# Patient Record
Sex: Male | Born: 1985 | Race: White | Hispanic: No | Marital: Single | Smoking: Never smoker
Health system: Southern US, Community
[De-identification: ages and names within clinical notes are randomized; demographics above are authoritative.]

## PROBLEM LIST (undated history)

## (undated) HISTORY — PX: EYE MUSCLE SURGERY: SHX370

---
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
## 2008-04-20 ENCOUNTER — Emergency Department (HOSPITAL_COMMUNITY): Admission: RE | Admit: 2008-04-20 | Discharge: 2008-04-20 | Payer: Self-pay | Admitting: Anesthesiology

## 2008-09-03 IMAGING — CR DG FINGER THUMB 2+V*R*
1 series · 1 of 1 positions shown · non-contrast
Comparison: None.

CLINICAL DATA: Trauma.
 RIGHT THUMB ? FOUR VIEWS:

[view not recorded]
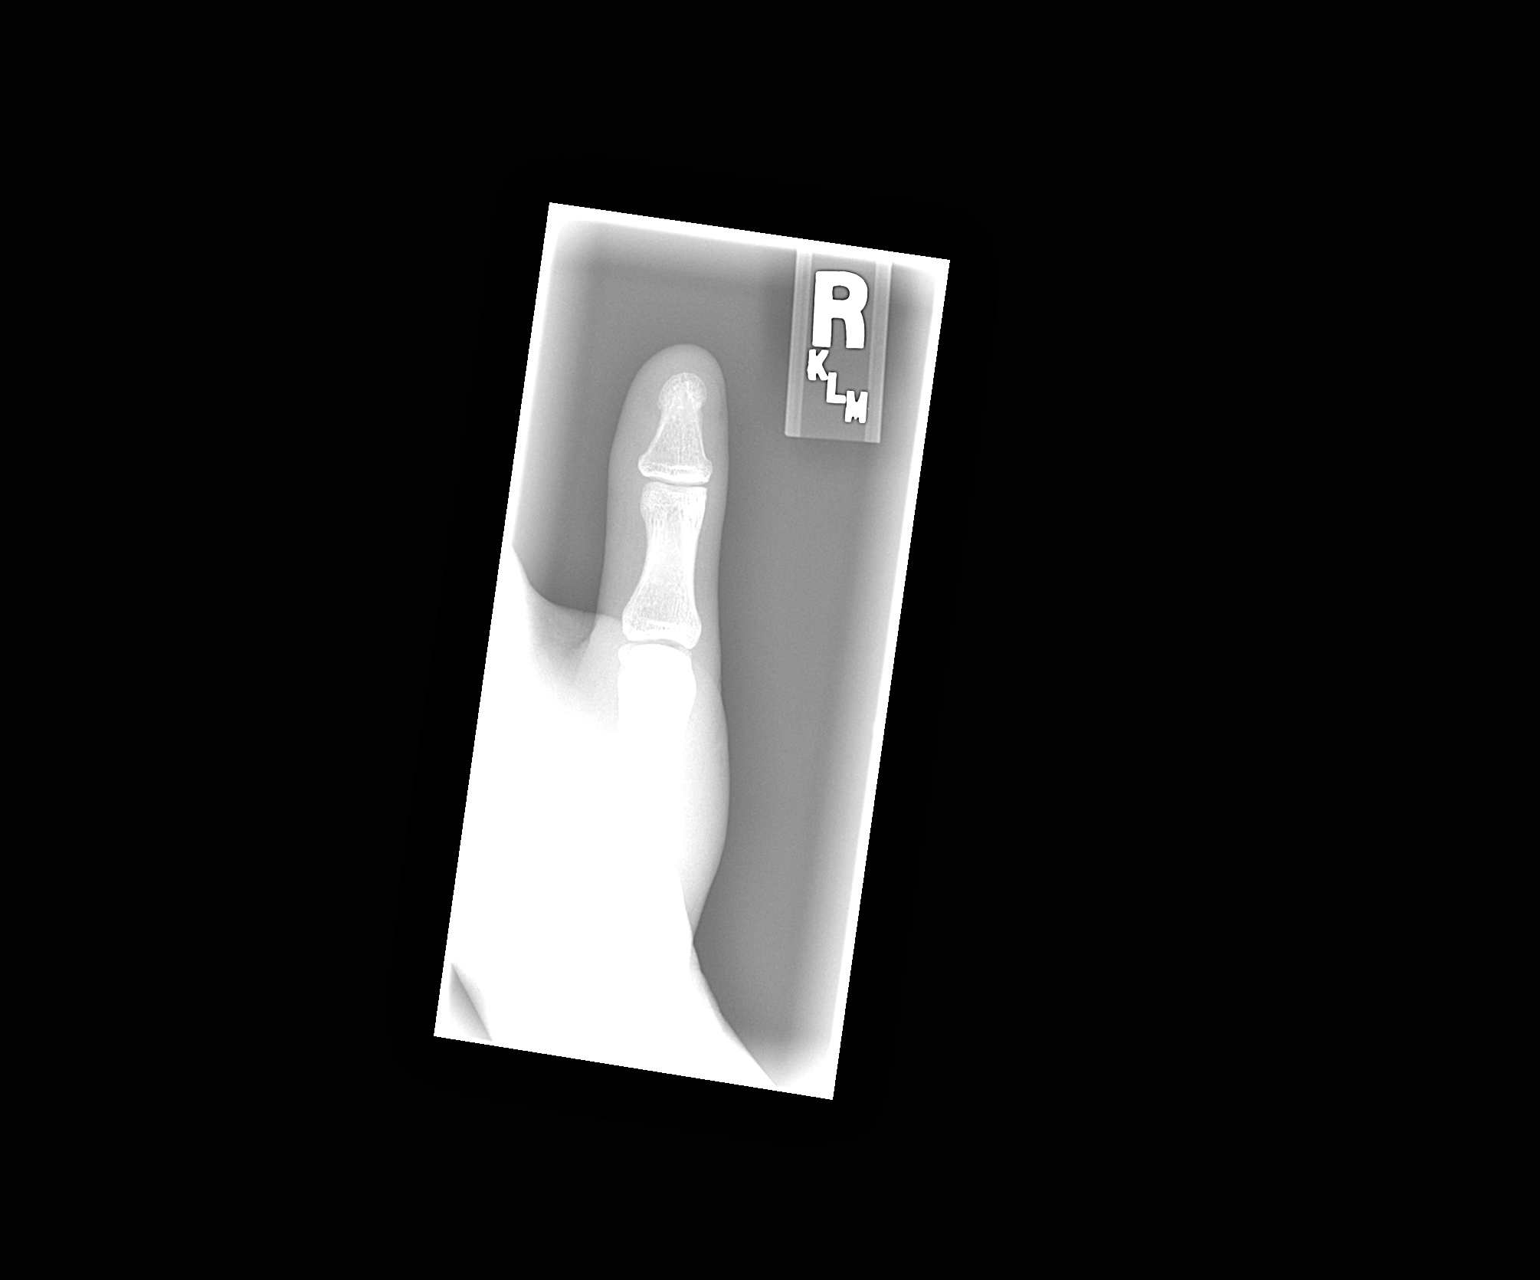

[1 of 1 positions shown; findings below may reference images not displayed]

FINDINGS: There is no fracture or dislocation.  No significant soft tissue swelling.  There are two sesamoid bones incidentally identified.
IMPRESSION: No acute osseous abnormality.

## 2010-02-23 ENCOUNTER — Encounter: Payer: Self-pay | Admitting: Gastroenterology

## 2010-09-05 IMAGING — CR DG CHEST 2V
3 series · 3 of 3 positions shown · non-contrast
Comparison: 10/21/2005.

CLINICAL DATA: Fever and cough.

CHEST - 2 VIEW

[w chest pa (1 of 2)]
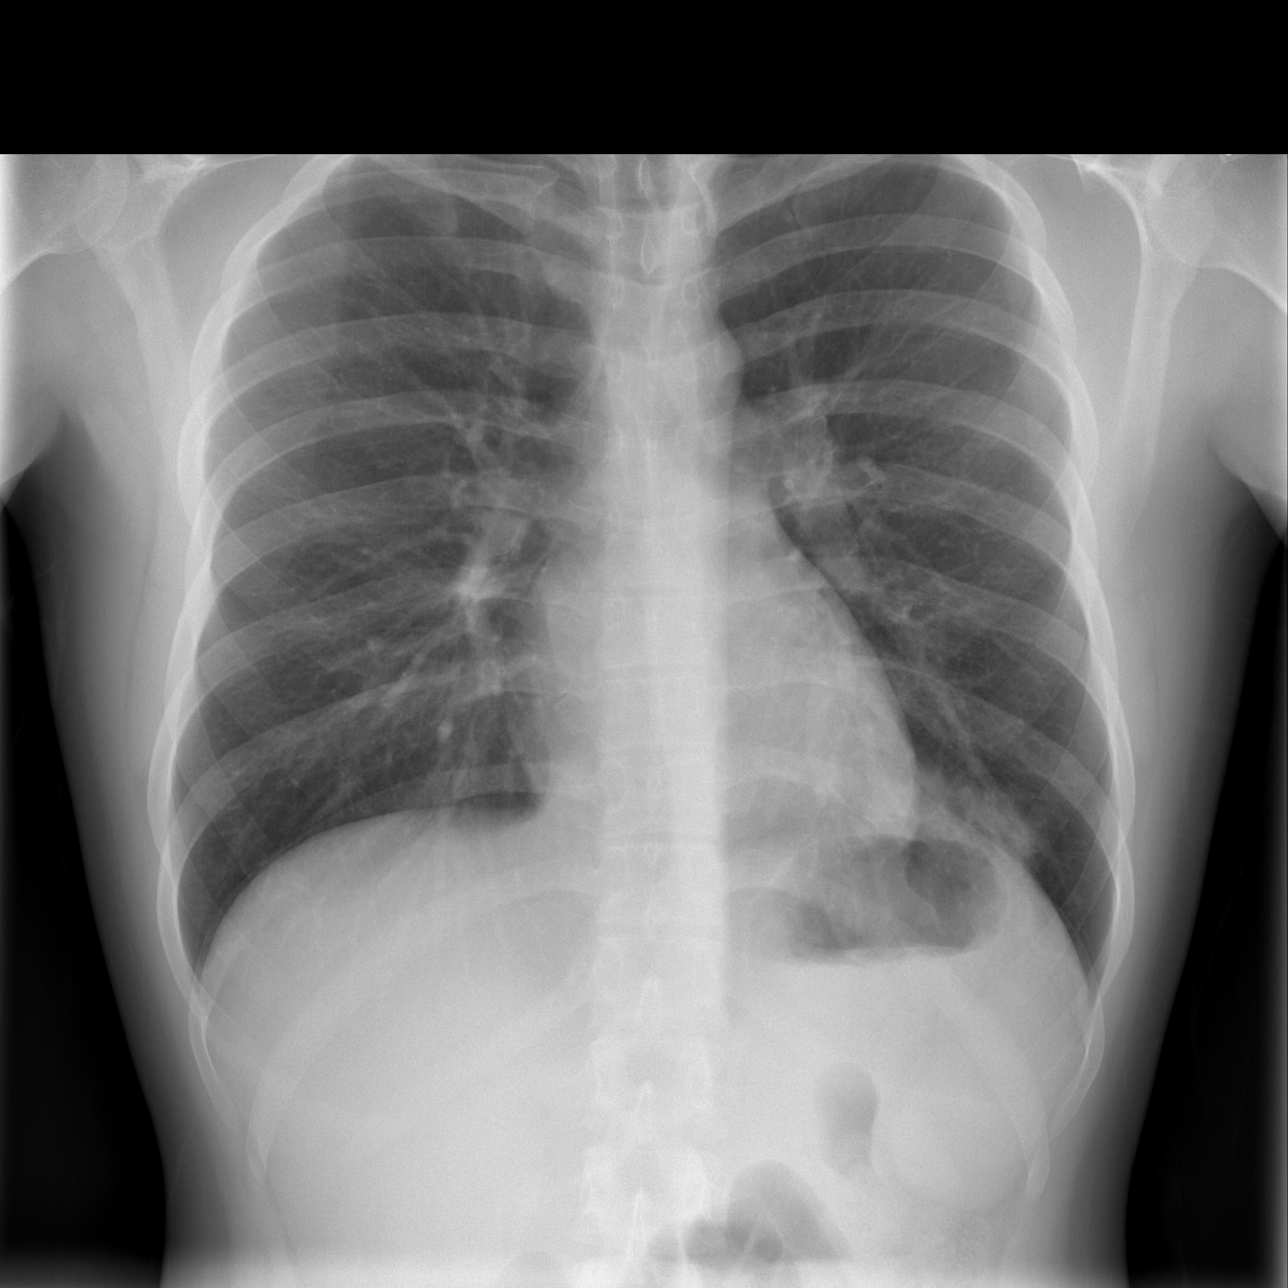

[w chest pa (2 of 2)]
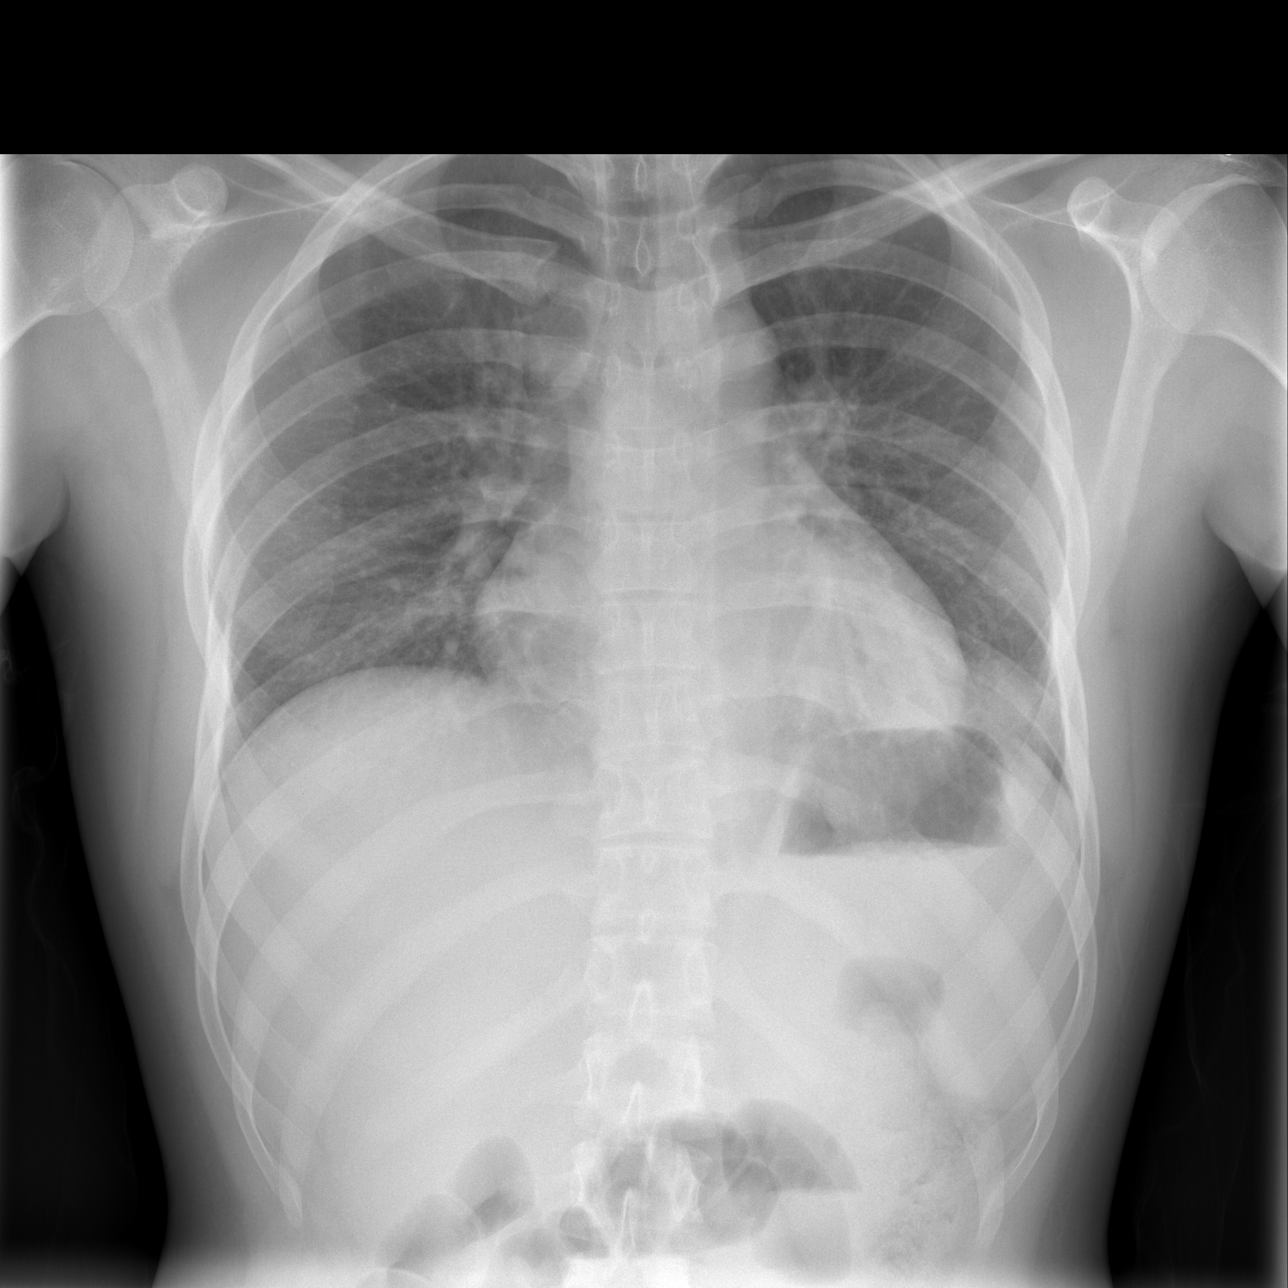

[w chest lat]
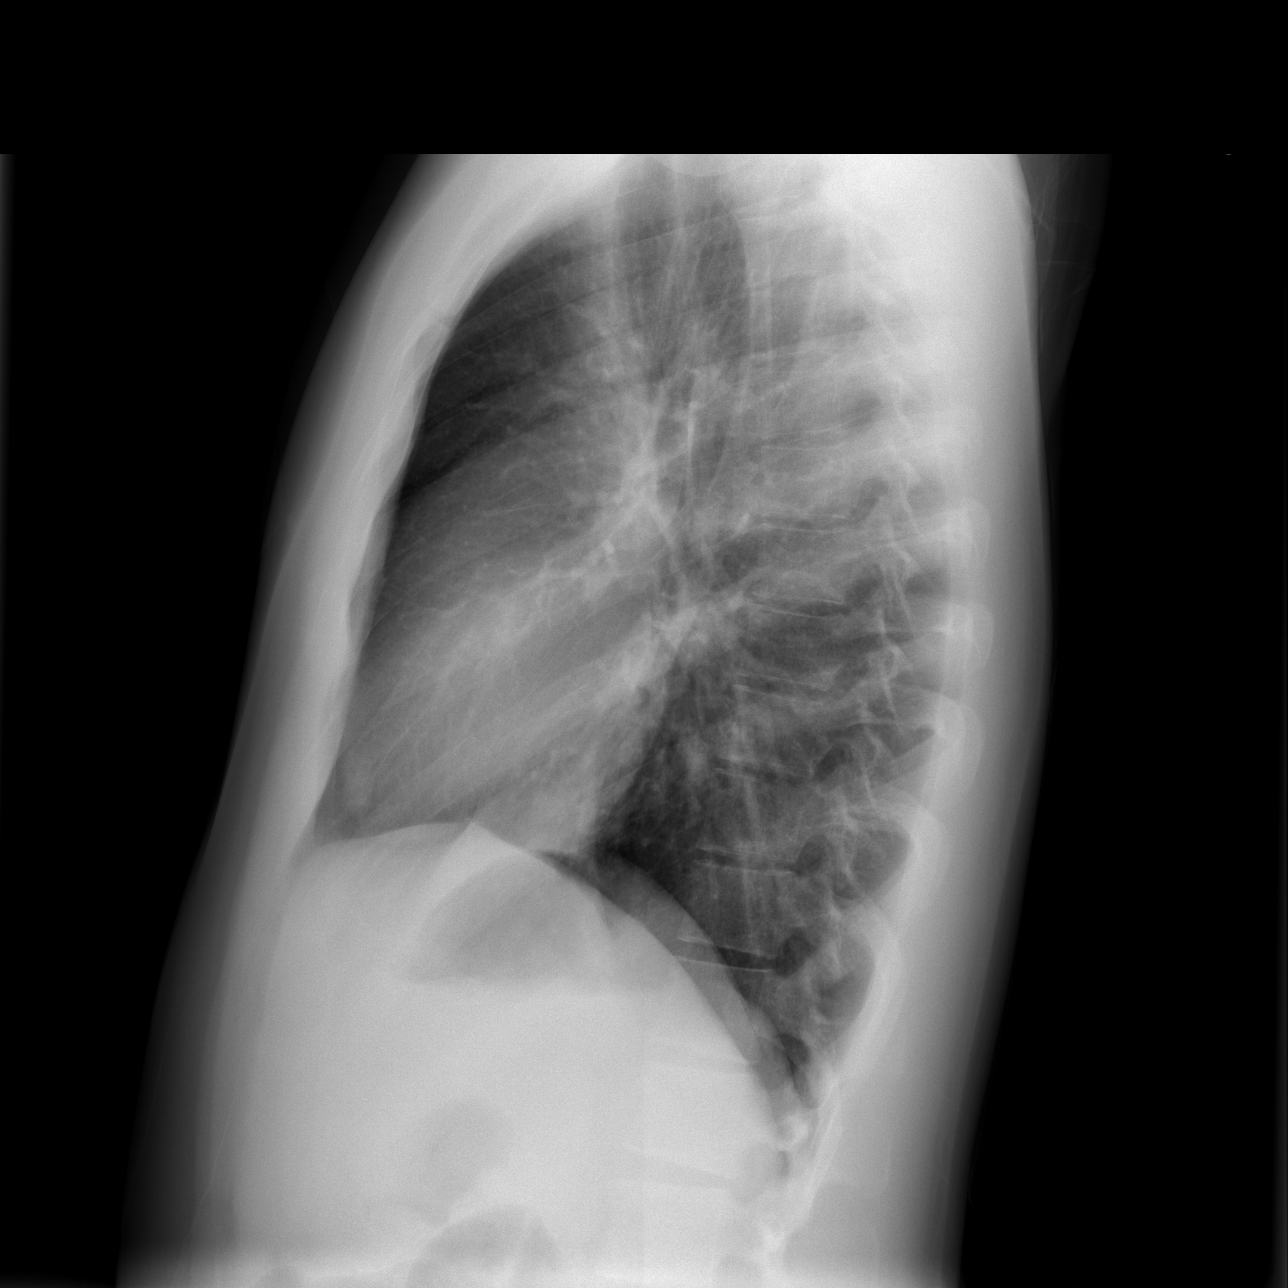

[3 of 3 positions shown; findings below may reference images not displayed]

FINDINGS: Two-view exam shows focal airspace disease at the left
lung base, either in the anterior left lower lower posterior
lingula, consistent with pneumonia.  Right lung is clear. The
cardiopericardial silhouette is within normal limits for size.
Imaged bony structures of the thorax are intact.
IMPRESSION: Focal airspace disease at the left lung base, consistent with
pneumonia.

## 2010-10-18 ENCOUNTER — Emergency Department (HOSPITAL_COMMUNITY): Payer: No Typology Code available for payment source

## 2010-10-18 ENCOUNTER — Emergency Department (HOSPITAL_COMMUNITY)
Admission: EM | Admit: 2010-10-18 | Discharge: 2010-10-18 | Disposition: A | Discharge status: Home / Self Care | Payer: No Typology Code available for payment source | Attending: Emergency Medicine | Admitting: Emergency Medicine

## 2010-10-18 DIAGNOSIS — S91309A Unspecified open wound, unspecified foot, initial encounter: Secondary | ICD-10-CM | POA: Insufficient documentation

## 2010-10-18 DIAGNOSIS — W268XXA Contact with other sharp object(s), not elsewhere classified, initial encounter: Secondary | ICD-10-CM | POA: Insufficient documentation

## 2015-11-28 ENCOUNTER — Ambulatory Visit: Payer: Self-pay | Admitting: Surgery

## 2015-11-28 NOTE — H&P (Signed)
History of Present Illness Gabriel Wagner. Gabriel Cina MD; 11/28/2015 10:42 AM) Patient words: hernia.  The patient is a 30 year old male who presents with an inguinal hernia. Second opinion - right inguinal hernia  He is referred by Dr. Blair Heys for a right inguinal hernia. The patient states that for several years he has noticed some discomfort and then an intermittent bulge in his right groin. It is noticeable with physical activity and particularly with sneezing and coughing. About a year ago he got a tight painful lump in the area which he had to push back in for relief. Since then he is just had some intermittent milder symptoms. No symptoms on the left. No previous history of hernia repair. Generally healthy.  Formerly he was fairly active with cycling and other types of vigorous activity. However he has stopped doing this and his hernia actually feels much better. However he is interested in having repair resume full activity.  His mother is Gabriel Wagner, a Marine scientist.  He is currently in graduate school at [employer] scheduled to finish in December 2017.   Problem List/Past Medical Gabriel Wagner, Gabriel Wagner; 11/28/2015 10:12 AM) INGUINAL HERNIA OF RIGHT SIDE WITHOUT OBSTRUCTION OR GANGRENE (K40.90)  Other Problems Gabriel Wagner, CMA; 11/28/2015 10:12 AM) Anxiety Disorder Depression  Past Surgical History Gabriel Wagner, CMA; 11/28/2015 10:12 AM) Oral Surgery  Diagnostic Studies History Gabriel Wagner, Gabriel Wagner; 11/28/2015 10:12 AM) Colonoscopy never  Allergies Gabriel Wagner, CMA; 11/28/2015 10:12 AM) No Known Drug Allergies 09/06/2015  Medication History Gabriel Wagner, CMA; 11/28/2015 10:12 AM) Lexapro (10MG  Tablet, Oral) Active. Medications Reconciled  Social History Gabriel Wagner, Gabriel Wagner; 11/28/2015 10:12 AM) Alcohol use Occasional alcohol use. Caffeine use Carbonated beverages, Coffee, Tea. No drug use Tobacco use Former  smoker.  Family History Gabriel Wagner, Gabriel Wagner; 11/28/2015 10:12 AM) Family history unknown First Degree Relatives    Vitals Gabriel Wagner CMA; 11/28/2015 10:12 AM) 11/28/2015 10:11 AM Weight: 191.13 lb Height: 67in Body Surface Area: 1.98 m Body Mass Index: 29.93 kg/m  Pulse: 72 (Regular)  BP: 118/76 (Sitting, Left Arm, Standard)      Physical Exam Gabriel Hazard K. Altamese Deguire MD; 11/28/2015 10:43 AM)  The physical exam findings are as follows: Note:WDWN in NAD Eyes: Pupils equal, round; sclera anicteric HENT: Oral mucosa moist; good dentition Neck: No masses palpated, no thyromegaly Lungs: CTA bilaterally; normal respiratory effort CV: Regular rate and rhythm; no murmurs; extremities well-perfused with no edema Abd: +bowel sounds, soft, non-tender, no palpable organomegaly; GU: bilateral descended testes; no testicular masses; small reducible right inguinal hernia; no sign of left inguinal hernia Skin: Warm, dry; no sign of jaundice Psychiatric - alert and oriented x 4; calm mood and affect    Assessment & Plan Gabriel Hazard K. Solveig Fangman MD; 11/28/2015 10:43 AM)  INGUINAL HERNIA OF RIGHT SIDE WITHOUT OBSTRUCTION OR GANGRENE (K40.90)  Current Plans Schedule for Surgery - right inguinal hernia repair with mesh. The surgical procedure has been discussed with the patient. Potential risks, benefits, alternative treatments, and expected outcomes have been explained. All of the patient's questions at this time have been answered. The likelihood of reaching the patient's treatment goal is good. The patient understand the proposed surgical procedure and wishes to proceed. Note:We discussed the options for repair, including laparoscopic versus open right inguinal hernia repair with mesh. After thorough discussion, we have decided to proceed with an open mesh repair.  Gabriel Wagner. Gabriel Skains, MD, [facility]  General/ Trauma Surgery  11/28/2015 10:44 AM

## 2016-02-03 HISTORY — PX: HERNIA REPAIR: SHX51

## 2017-01-04 ENCOUNTER — Encounter (INDEPENDENT_AMBULATORY_CARE_PROVIDER_SITE_OTHER): Payer: Self-pay

## 2017-01-04 ENCOUNTER — Encounter (HOSPITAL_COMMUNITY): Payer: Self-pay | Admitting: Psychiatry

## 2017-01-04 ENCOUNTER — Ambulatory Visit (INDEPENDENT_AMBULATORY_CARE_PROVIDER_SITE_OTHER): Payer: BLUE CROSS/BLUE SHIELD | Admitting: Psychiatry

## 2017-01-04 DIAGNOSIS — Z79899 Other long term (current) drug therapy: Secondary | ICD-10-CM | POA: Diagnosis not present

## 2017-01-04 DIAGNOSIS — F902 Attention-deficit hyperactivity disorder, combined type: Secondary | ICD-10-CM | POA: Diagnosis not present

## 2017-01-04 DIAGNOSIS — F3341 Major depressive disorder, recurrent, in partial remission: Secondary | ICD-10-CM | POA: Diagnosis not present

## 2017-01-04 DIAGNOSIS — R4581 Low self-esteem: Secondary | ICD-10-CM | POA: Diagnosis not present

## 2017-01-04 MED ORDER — LISDEXAMFETAMINE DIMESYLATE 60 MG PO CAPS: 60.0000 mg | ORAL_CAPSULE | Freq: Every day | ORAL | 0 refills | Status: DC

## 2017-01-04 MED ORDER — FLUOXETINE HCL 20 MG PO CAPS: 20.0000 mg | ORAL_CAPSULE | Freq: Every day | ORAL | 1 refills | Status: DC

## 2017-01-04 NOTE — Progress Notes (Signed)
Psychiatric Initial Adult Assessment   Patient Identification: Gabriel Wagner MRN:  [id number] Date of Evaluation:  01/04/2017 Referral Source: self Chief Complaint:  [facility] Diagnosis:    ICD-10-CM   1. Attention deficit hyperactivity disorder (ADHD), combined type F90.2 lisdexamfetamine (VYVANSE) 60 MG capsule    lisdexamfetamine (VYVANSE) 60 MG capsule    lisdexamfetamine (VYVANSE) 60 MG capsule    DISCONTINUED: lisdexamfetamine (VYVANSE) 60 MG capsule    DISCONTINUED: lisdexamfetamine (VYVANSE) 60 MG capsule    DISCONTINUED: lisdexamfetamine (VYVANSE) 60 MG capsule  2. Recurrent major depressive disorder, in partial remission (HCC) F33.41 FLUoxetine (PROZAC) 20 MG capsule    History of Present Illness:  Gabriel Wagner is a 31 year old male with a psychiatric history of ADHD diagnosed at age 89, who has been long managed on stimulant therapies with good control.  He reports that he experienced an episode of depression approximately 5 years ago, and another recent bout of depression approximately 1 year ago.  He has been having psychiatric medication management at [facility], but requests switching his care because he would like to see a physician.   I spent time with the patient getting to know him in learning about his fairly introverted personality style.  He has a Event organisermasters degree in Buyer, retailenvironmental sciences from [employer].  In his free time he likes to play video games and read books, and spend time with his dog.  He currently lives [place] in the [place] area, and his father lives in [place] area.  He reports that he has a sister who is almost 2 years older and they are very close.  He is not currently dating and does not particularly have any interest in romantic relationships at this time.  He reports that he has always struggled with fairly poor self-confidence, and has difficulty in confidence especially when socializing  with others.  He struggles with difficulty with motivation, but some difficulty falling asleep due to rumination and anxiety, he often second guesses himself.  He has trouble with low energy, and wants to stay in bed some days even though he knows he needs to get work done around the house.  He is not currently employed, as it has been challenging to find a job in the area given his particular specific degree.  He continues to apply to the multiple job opportunities nationwide.  He has felt particularly discouraged given that he graduated 1 year ago and has not found employment.  He denies any suicidal thoughts or self-harm.  He drinks alcohol sparingly and has not used marijuana in many years.  Regarding medications, Vyvanse 60 mg appears to be effective for his ADHD symptoms.  Reviewed the [place] controlled substance database and it appears to be appropriate.  He has never been on any antidepressant other than Lexapro.  He feels that Lexapro did help with his first bout of depression in 2013, but it has not been fully effective for the past 6 months to year.  He feels like his mood is not measurable, but not happy.  We discussed switching him to fluoxetine given its higher potency and higher dosing range.  Reviewed the risks and benefits of fluoxetine including common side effects such as GI, sexual, headaches, bruxism.  He agrees to follow-up with writer in 10-12 weeks, and to establish group therapy in this office, as I suspect interpersonal therapies involving group interaction will be most beneficial to him at this time.  Associated  Signs/Symptoms: Depression Symptoms:  depressed mood, anhedonia, fatigue, feelings of worthlessness/guilt, difficulty concentrating, (Hypo) Manic Symptoms:  none Anxiety Symptoms:  Social Anxiety, Psychotic Symptoms:  none PTSD Symptoms: Negative  Past Psychiatric History: No psychiatric hospitalizations, he has had psychiatric medication management at  [facility] with Misty StanleyLisa  Previous Psychotropic Medications: Yes - various stimulants, Vyvanse was best.  Lexapro  Substance Abuse History in the last 12 months:  No.  Consequences of Substance Abuse: Negative  Past Medical History: No past medical history on file.   Family Psychiatric History: Family history of severe depression in his grandfather  Family History: No family history on file.  Social History:   Social History   Socioeconomic History  . Marital status: Single    Spouse name: Not on file  . Number of children: Not on file  . Years of education: Not on file  . Highest education level: Not on file  Social Needs  . Financial resource strain: Not on file  . Food insecurity - worry: Not on file  . Food insecurity - inability: Not on file  . Transportation needs - medical: Not on file  . Transportation needs - non-medical: Not on file  Occupational History  . Not on file  Tobacco Use  . Smoking status: Not on file  Substance and Sexual Activity  . Alcohol use: Not on file  . Drug use: Not on file  . Sexual activity: Not on file  Other Topics Concern  . Not on file  Social History Narrative  . Not on file    Additional Social History: Has a masters degree from [employer] in Camera operatorenvironmental sciences and management, wants to work in environmental conservation, not currently married, no children, does not use drugs or drink alcohol excessively  Allergies:  Allergies not on file  Metabolic Disorder Labs: No results found for: HGBA1C, MPG No results found for: PROLACTIN No results found for: CHOL, TRIG, HDL, CHOLHDL, VLDL, LDLCALC   Current Medications: Current Outpatient Medications  Medication Sig Dispense Refill  . FLUoxetine (PROZAC) 20 MG capsule Take 1 capsule (20 mg total) by mouth daily. In 3-4 weeks, okay to increase to 40 mg daily 90 capsule 1  . [START ON 01/29/2017] lisdexamfetamine (VYVANSE) 60 MG capsule Take 1 capsule (60 mg  total) by mouth daily before breakfast. 30 capsule 0  . [START ON 02/28/2017] lisdexamfetamine (VYVANSE) 60 MG capsule Take 1 capsule (60 mg total) by mouth daily before breakfast. 30 capsule 0  . [START ON 03/30/2017] lisdexamfetamine (VYVANSE) 60 MG capsule Take 1 capsule (60 mg total) by mouth daily before breakfast. 30 capsule 0   No current facility-administered medications for this visit.     Neurologic: Headache: Negative Seizure: Negative Paresthesias:Negative  Musculoskeletal: Strength & Muscle Tone: within normal limits Gait & Station: normal Patient leans: N/A  Psychiatric Specialty Exam: Review of Systems  Constitutional: Negative.   HENT: Negative.   Eyes: Negative.   Respiratory: Negative.   Cardiovascular: Negative.   Gastrointestinal: Negative.   Musculoskeletal: Negative.   Neurological: Negative.   Psychiatric/Behavioral: Positive for depression. The patient has insomnia.     There were no vitals taken for this visit.There is no height or weight on file to calculate BMI.  General Appearance: Casual and Well Groomed  Eye Contact:  Good  Speech:  Clear and Coherent  Volume:  Normal  Mood:  Anxious and Dysphoric  Affect:  Appropriate and Congruent  Thought Process:  Goal Directed and Descriptions of  Associations: Intact  Orientation:  Full (Time, Place, and Person)  Thought Content:  Logical and Rumination  Suicidal Thoughts:  No  Homicidal Thoughts:  No  Memory:  Immediate;   Fair  Judgement:  Fair  Insight:  Fair  Psychomotor Activity:  Normal  Concentration:  Attention Span: Fair  Recall:  [employer] of Knowledge:Fair  Language: Good  Akathisia:  Negative  Handed:  Right  AIMS (if indicated):  n/a  Assets:  Communication Skills Desire for Improvement Financial Resources/Insurance Housing Leisure Time Physical Health Social Support Transportation Vocational/Educational  ADL's:  Intact  Cognition: WNL  Sleep:  6-8 hours, onsent insomnia     Treatment Plan Summary: JOSMAR MESSIMER is a 31 year old male with a psychiatric history consistent with combined ADHD, long-standing since childhood, and recurrent major depressive disorder in partial remission.  He presents as fairly introverted, and struggles with poor self-esteem, specifically in light of his recent difficulties in finding employment.  He would benefit from both group therapy and change to a more potent antidepressant.  We agreed to proceed as below and follow-up in 10-12 weeks.  He does not present any acute safety issues or substance use.  1. Attention deficit hyperactivity disorder (ADHD), combined type   2. Recurrent major depressive disorder, in partial remission (HCC)     Status of current problems: new  Labs Ordered: No orders of the defined types were placed in this encounter.   Labs Reviewed: n/a  Collateral Obtained/Records Reviewed: [place] controlled substance database - seen Ellis Savage for the last year for med management  Plan:  Vyvanse 60 mg daily; 3 months prescribed Stop Lexapro 10 mg daily Initiate Prozac 20 mg, increased to 40 mg in 3-4 weeks as tolerated RTC 10-12 weeks Group therapy in office  I spent 50 minutes with the patient in direct face-to-face clinical care.  Greater than 50% of this time was spent in counseling and coordination of care with the patient.    [name], MD 12/3/201810:18 AM

## 2017-01-29 ENCOUNTER — Other Ambulatory Visit (HOSPITAL_COMMUNITY): Payer: Self-pay | Admitting: Psychiatry

## 2017-01-29 DIAGNOSIS — F902 Attention-deficit hyperactivity disorder, combined type: Secondary | ICD-10-CM

## 2017-02-10 ENCOUNTER — Ambulatory Visit (HOSPITAL_COMMUNITY): Payer: Self-pay | Admitting: Licensed Clinical Social Worker

## 2017-02-17 ENCOUNTER — Ambulatory Visit (INDEPENDENT_AMBULATORY_CARE_PROVIDER_SITE_OTHER): Payer: BLUE CROSS/BLUE SHIELD | Admitting: Licensed Clinical Social Worker

## 2017-02-17 DIAGNOSIS — F3341 Major depressive disorder, recurrent, in partial remission: Secondary | ICD-10-CM | POA: Diagnosis not present

## 2017-02-17 DIAGNOSIS — F902 Attention-deficit hyperactivity disorder, combined type: Secondary | ICD-10-CM

## 2017-02-19 ENCOUNTER — Encounter (HOSPITAL_COMMUNITY): Payer: Self-pay | Admitting: Licensed Clinical Social Worker

## 2017-02-19 NOTE — Progress Notes (Signed)
  Weekly Group Progress Note  Program: OUTPATIENT SKILLS GROUP  Group Time: 5:30-6:30pm  Participation Level: Active  Behavioral Response: Appropriate and Sharing  Type of Therapy:  Psycho-education Group  Skills discussed: Self Identity, Mindfulness   Summary of Progress: Pt was new to group and introduced himself. He states he wants help w/ depression and isolating. Pt shared openly and admitted he has had a hx of "having nervous breakdowns" from depression.   Summary of Group: Counselor engaged pts in group psychoeducation on self identity, mindfulness, and recovery for depression, anxiety, and addiction. Counselor assisted 2 new members in introducing themselves and identifying how the group could help them meet their goals.    Gabriel Wagner, LPCA, LCASA

## 2017-02-24 ENCOUNTER — Ambulatory Visit (HOSPITAL_COMMUNITY): Payer: Self-pay | Admitting: Licensed Clinical Social Worker

## 2017-03-03 ENCOUNTER — Ambulatory Visit (INDEPENDENT_AMBULATORY_CARE_PROVIDER_SITE_OTHER): Payer: BLUE CROSS/BLUE SHIELD | Admitting: Licensed Clinical Social Worker

## 2017-03-03 DIAGNOSIS — F902 Attention-deficit hyperactivity disorder, combined type: Secondary | ICD-10-CM | POA: Diagnosis not present

## 2017-03-03 DIAGNOSIS — F3341 Major depressive disorder, recurrent, in partial remission: Secondary | ICD-10-CM | POA: Diagnosis not present

## 2017-03-04 ENCOUNTER — Encounter (HOSPITAL_COMMUNITY): Payer: Self-pay | Admitting: Licensed Clinical Social Worker

## 2017-03-04 NOTE — Progress Notes (Signed)
  Weekly Group Progress Note  Program: OUTPATIENT SKILLS GROUP  Group Time: 5:30-6:30pm  Participation Level: Active  Behavioral Response: Appropriate and Sharing  Type of Therapy:  Psycho-education Group  Skills discussed: Self Compassion   Summary of Progress: Pt was active and engaged. He spoke openly and somewhat defensively about how "saying something to yourself does not make it so". Pt struggled to believe in the power of positive thinking but was encouraged to "try it out" by counselor. Pt checked in as a "7 on a 10point scale w/ 10 being most positive". Summary of Group:  Pts were active and engaged in group psychoed session in which counselor taught on the concept and practice of "self compassion", mindfulness, and self-kindness. Counselor used CBT-style investigating techniques to help pts challenge their  "negative shoulds".    Margo CommonWesley E Swan, LPCA, LCASA

## 2017-03-10 ENCOUNTER — Ambulatory Visit (INDEPENDENT_AMBULATORY_CARE_PROVIDER_SITE_OTHER): Payer: BLUE CROSS/BLUE SHIELD | Admitting: Licensed Clinical Social Worker

## 2017-03-10 DIAGNOSIS — F902 Attention-deficit hyperactivity disorder, combined type: Secondary | ICD-10-CM | POA: Diagnosis not present

## 2017-03-10 DIAGNOSIS — F3341 Major depressive disorder, recurrent, in partial remission: Secondary | ICD-10-CM

## 2017-03-12 ENCOUNTER — Encounter (HOSPITAL_COMMUNITY): Payer: Self-pay | Admitting: Licensed Clinical Social Worker

## 2017-03-12 NOTE — Progress Notes (Signed)
  Weekly Group Progress Note  Program: OUTPATIENT SKILLS GROUP  Group Time: 5:30-6:30pm  Participation Level: Active  Behavioral Response: Appropriate and Sharing  Type of Therapy:  Psycho-education Group  Skills discussed: CBT ABC    Summary of Progress: Pt was active during group and shared about his feeling "stuck w/ the issue of Self Compassion (last week's topic)". Pt reports he continues to get "fixated on his negative 'shoulds' he has for himself such as 'I should be out w/ friends more'". Pt appeared somewhat detached from group discussion and noticeably did not make eye contact w/ others when they spoke. Pt states he feels like the color "smokey blue" since he is feeling neutral but "not ordinary".   Summary of Group: Pts were active and engaged in session. Counselor asked pts to state "what color they felt like today" to build self awareness and abstraction. 2 new group members were present and shared openly about their struggle w/ substance addiction. Counselor showed brief video describing ABC model and how it can help pts to minimize and challenge their negative beliefs about activating events.    Margo CommonWesley E Al Gagen, LPCA, LCASA

## 2017-03-17 ENCOUNTER — Ambulatory Visit (INDEPENDENT_AMBULATORY_CARE_PROVIDER_SITE_OTHER): Payer: BLUE CROSS/BLUE SHIELD | Admitting: Licensed Clinical Social Worker

## 2017-03-17 DIAGNOSIS — F902 Attention-deficit hyperactivity disorder, combined type: Secondary | ICD-10-CM | POA: Diagnosis not present

## 2017-03-17 DIAGNOSIS — F3341 Major depressive disorder, recurrent, in partial remission: Secondary | ICD-10-CM | POA: Diagnosis not present

## 2017-03-22 ENCOUNTER — Encounter (HOSPITAL_COMMUNITY): Payer: Self-pay | Admitting: Licensed Clinical Social Worker

## 2017-03-22 NOTE — Progress Notes (Signed)
  Weekly Group Progress Note  Program: OUTPATIENT SKILLS GROUP  Group Time: 5:30-6:30pm  Participation Level: Active  Behavioral Response: Appropriate and Sharing  Type of Therapy:  Psycho-education Group  Skills discussed: Mindfulness, Concentration   Summary of Progress: Pt was active and engaged and less agitated than previous sessions. He reported he is continuing to notice his internal self talk, "shoulds", and negative thoughts that impact his functioning. He states that learning to "label his thoughts has been helpful". Pt stated he has been practicing mindfulness script for over 337yrs when he was first dx w/ migraines and it has proven very helpful.  Summary of Group: Patients were active and engaged in psychoeducation skill building group designed to help sustain recovery from anxiety, depression, and substance addiction. Pts were asked to rate their current feeling state on a 1-10 scale. Pts were led in a mindfulness exercise to help build focus and concentration. Counselor discussed pt's experience of meditation and how it could be applied to their unique recoveries.   Margo CommonWesley E Swan, LPCA, LCASA

## 2017-03-29 ENCOUNTER — Encounter (HOSPITAL_COMMUNITY): Payer: Self-pay | Admitting: Psychiatry

## 2017-03-29 ENCOUNTER — Ambulatory Visit (INDEPENDENT_AMBULATORY_CARE_PROVIDER_SITE_OTHER): Payer: BLUE CROSS/BLUE SHIELD | Admitting: Psychiatry

## 2017-03-29 DIAGNOSIS — F3341 Major depressive disorder, recurrent, in partial remission: Secondary | ICD-10-CM | POA: Diagnosis not present

## 2017-03-29 DIAGNOSIS — F902 Attention-deficit hyperactivity disorder, combined type: Secondary | ICD-10-CM

## 2017-03-29 DIAGNOSIS — Z79899 Other long term (current) drug therapy: Secondary | ICD-10-CM

## 2017-03-29 DIAGNOSIS — G479 Sleep disorder, unspecified: Secondary | ICD-10-CM | POA: Diagnosis not present

## 2017-03-29 MED ORDER — ARIPIPRAZOLE 2 MG PO TABS: 2.0000 mg | ORAL_TABLET | Freq: Every day | ORAL | 2 refills | Status: DC

## 2017-03-29 MED ORDER — LISDEXAMFETAMINE DIMESYLATE 60 MG PO CAPS: 60.0000 mg | ORAL_CAPSULE | Freq: Every day | ORAL | 0 refills | Status: DC

## 2017-03-29 MED ORDER — FLUOXETINE HCL 40 MG PO CAPS: 40.0000 mg | ORAL_CAPSULE | Freq: Every day | ORAL | 1 refills | Status: DC

## 2017-03-29 NOTE — Progress Notes (Signed)
BH MD/PA/NP OP Progress Note  03/29/2017 3:07 PM Gabriel HoarBenjamin R Betten  MRN:  [id number]  Chief Complaint: depression  HPI: Gabriel Wagner feels that his apathy has improved, but he continues to struggle with anhedonia.  He is going to be working in individual therapy with Wes and feels hopeful that this will also help.  He reports that not much has changed, but with further questioning it appears that he has gotten a job, and is playing less video games and is spending more time with friends.  He has limited insight into some of the improvements.  He continues to have middle of the night awakenings and morning melancholy.  He has been participating in group therapy which has been helpful.  He denies any acute safety issues.  Spent time discussing the utility of Abilify as an augmenting agent with Prozac and he was agreeable to this intervention. we discussed EPS including TD, akathisia, tremulousness, dystonia, and metabolic effects of atypical antipsychotic.  He agrees to initiate low dose and agrees to follow-up in 8 weeks.  Visit Diagnosis:    ICD-10-CM   1. Recurrent major depressive disorder, in partial remission (HCC) F33.41 FLUoxetine (PROZAC) 40 MG capsule    ARIPiprazole (ABILIFY) 2 MG tablet  2. Attention deficit hyperactivity disorder (ADHD), combined type F90.2 lisdexamfetamine (VYVANSE) 60 MG capsule    Past Psychiatric History: See intake H&P for full details. Reviewed, with no updates at this time.   Past Medical History: History reviewed. No pertinent past medical history.  Past Surgical History:  Procedure Laterality Date  . EYE MUSCLE SURGERY    . HERNIA REPAIR  2018    Family Psychiatric History: See intake H&P for full details. Reviewed, with no updates at this time.   Family History: History reviewed. No pertinent family history.  Social History:  Social History   Socioeconomic History  . Marital status: Single    Spouse name: None  . Number of children: None   . Years of education: None  . Highest education level: None  Social Needs  . Financial resource strain: None  . Food insecurity - worry: None  . Food insecurity - inability: None  . Transportation needs - medical: None  . Transportation needs - non-medical: None  Occupational History  . None  Tobacco Use  . Smoking status: Never Smoker  . Smokeless tobacco: Never Used  Substance and Sexual Activity  . Alcohol use: Yes    Alcohol/week: 1.2 oz    Types: 2 Glasses of wine per week    Comment: once a month or less  . Drug use: No  . Sexual activity: Not Currently  Other Topics Concern  . None  Social History Narrative  . None    Allergies: No Known Allergies  Metabolic Disorder Labs: No results found for: HGBA1C, MPG No results found for: PROLACTIN No results found for: CHOL, TRIG, HDL, CHOLHDL, VLDL, LDLCALC No results found for: TSH  Therapeutic Level Labs: No results found for: LITHIUM No results found for: VALPROATE No components found for:  CBMZ  Current Medications: Current Outpatient Medications  Medication Sig Dispense Refill  . FLUoxetine (PROZAC) 40 MG capsule Take 1 capsule (40 mg total) by mouth daily. 90 capsule 1  . [START ON 03/30/2017] lisdexamfetamine (VYVANSE) 60 MG capsule Take 1 capsule (60 mg total) by mouth daily before breakfast. 30 capsule 0  . [START ON 04/28/2017] lisdexamfetamine (VYVANSE) 60 MG capsule Take 1 capsule (60 mg total) by mouth daily before breakfast.  30 capsule 0  . ARIPiprazole (ABILIFY) 2 MG tablet Take 1 tablet (2 mg total) by mouth daily. 30 tablet 2  . lisdexamfetamine (VYVANSE) 60 MG capsule Take 1 capsule (60 mg total) by mouth daily before breakfast. 30 capsule 0   No current facility-administered medications for this visit.      Musculoskeletal: Strength & Muscle Tone: within normal limits Gait & Station: normal Patient leans: N/A  Psychiatric Specialty Exam: ROS  Blood pressure 122/80, pulse 89, height 5\' 7"   (1.702 m), weight 189 lb 12.8 oz (86.1 kg).Body mass index is 29.73 kg/m.  General Appearance: Casual and Fairly Groomed  Eye Contact:  Good  Speech:  Clear and Coherent and Normal Rate  Volume:  Normal  Mood:  Dysphoric  Affect:  Congruent  Thought Process:  Goal Directed and Descriptions of Associations: Intact  Orientation:  Full (Time, Place, and Person)  Thought Content: Logical   Suicidal Thoughts:  No  Homicidal Thoughts:  No  Memory:  Immediate;   Fair  Judgement:  Fair  Insight:  Fair  Psychomotor Activity:  Normal  Concentration:  Attention Span: Good  Recall:  Good  Fund of Knowledge: Good  Language: Good  Akathisia:  Negative  Handed:  Right  AIMS (if indicated): not done  Assets:  Communication Skills Desire for Improvement Financial Resources/Insurance Housing Social Support Transportation Vocational/Educational  ADL's:  Intact  Cognition: WNL  Sleep:  Good   Screenings:  Assessment and Plan: [name] presents with slight improvements in mood, but continues to feel quite depressed, and anhedonic.  He has been participating well in group therapy and will be starting individual therapy also.  We agreed to initiate Abilify augmentation with continued fluoxetine.  He is happy to share that he has gotten a job recently which is a positive change for him.  No acute safety issues and we will follow-up in 8 weeks.  1. Recurrent major depressive disorder, in partial remission (HCC)   2. Attention deficit hyperactivity disorder (ADHD), combined type    Status of current problems: unchanged  Labs Ordered: No orders of the defined types were placed in this encounter.  Labs Reviewed: n/a  Collateral Obtained/Records Reviewed: n/a  Plan:  Abilify 2 mg daily Prozac 40 mg daily Vyvanse 60 mg daily  I spent 25 minutes with the patient in direct face-to-face clinical care.  Greater than 50% of this time was spent in counseling and coordination of care with  the patient.    [name], MD 03/29/2017, 3:07 PM

## 2017-04-06 ENCOUNTER — Encounter (HOSPITAL_COMMUNITY): Payer: Self-pay | Admitting: Licensed Clinical Social Worker

## 2017-04-06 ENCOUNTER — Ambulatory Visit (INDEPENDENT_AMBULATORY_CARE_PROVIDER_SITE_OTHER): Payer: BLUE CROSS/BLUE SHIELD | Admitting: Licensed Clinical Social Worker

## 2017-04-06 DIAGNOSIS — F3341 Major depressive disorder, recurrent, in partial remission: Secondary | ICD-10-CM

## 2017-04-07 ENCOUNTER — Ambulatory Visit (INDEPENDENT_AMBULATORY_CARE_PROVIDER_SITE_OTHER): Payer: BLUE CROSS/BLUE SHIELD | Admitting: Licensed Clinical Social Worker

## 2017-04-07 DIAGNOSIS — F3341 Major depressive disorder, recurrent, in partial remission: Secondary | ICD-10-CM | POA: Diagnosis not present

## 2017-04-07 DIAGNOSIS — F902 Attention-deficit hyperactivity disorder, combined type: Secondary | ICD-10-CM | POA: Diagnosis not present

## 2017-04-08 ENCOUNTER — Encounter (HOSPITAL_COMMUNITY): Payer: Self-pay | Admitting: Licensed Clinical Social Worker

## 2017-04-08 NOTE — Progress Notes (Signed)
   THERAPIST PROGRESS NOTE  Session Time: 10-11  Participation Level: Active  Behavioral Response: Well GroomedAlertEuthymic  Type of Therapy: Individual Therapy  Treatment Goals addressed: Anxiety and Diagnosis: MDD  Interventions: CBT and Supportive  Summary: Gabriel HoarBenjamin R Wagner is a 32 y.o. male who presents with hx of anxious depression. He is active and engaged in session, well dressed, and has recently received a haircut. He appears more upbeat than previous group sessions. He reports he is feeling better but has not yet "felt the effects of adding Abilify" per Dr. Unice BaileyEksir's recommendation.   Pt discusses his hx of depression and anxiety beginning "primarily in 2016" when he had to take academic leave from his masters at [employer] in the final month before graduation. He states he felt "off, was smoking a lot of weed, and felt incredibly anxious most days". Pt states he took some time off, worked part time for a year, and ended up finishing his masters in [employer] at [employer] in May 2017. Pt reports he currently feels a near constant "fear of rejection" and called it a "theme of his life", which pt states is exhausting. Pt reports having to feel "on top of the world in order to give my best".   Pt spends extended time discussing his HS experience, liking it, feeling successful at [employer]. He had strong social support, friend group, and felt challenged and appreciated by his teachers.   Pt is interested in discussing Enneagram types, per recommendation of counseor. Pt believes he is a type 5 w/ a 6 wing.   Suicidal/Homicidal: Nowithout intent/plan  Therapist Response: Counselor used cognitive approaches to help pt identify irrational and challenge irrational beliefs. Counselor gathered social history and specific social patterns from [employer] and college. Counselor used open questions and supportive encouragement, reflecting that pt is reporting improved mood and  decrease in depression sxs.  Plan: Return again in 2 weeks.  Diagnosis:    ICD-10-CM   1. Recurrent major depressive disorder, in partial remission [facility](HCC) F33.41        Margo CommonWesley E Swan, LCAS-A 04/08/2017

## 2017-04-09 ENCOUNTER — Encounter (HOSPITAL_COMMUNITY): Payer: Self-pay | Admitting: Licensed Clinical Social Worker

## 2017-04-09 NOTE — Progress Notes (Signed)
  Weekly Group Progress Note  Program: OUTPATIENT SKILLS GROUP  Group Time: 5:30-6:30pm  Participation Level: Active  Behavioral Response: Appropriate and Sharing  Type of Therapy:  Psycho-education Group  Skills discussed: Honesty in recovery, Self talk   Summary of Progress: Pt was active and engaged. He reported he continues to feel more stable and upbeat in past 2 weeks. He was well-groomed and smiled often. He shared that he enjoyed "getting other perspectives" from the group's mixture of mental health dxs. Pt gained insight and shared about his anxiety w/ his family over the most recent Thanksgiving dinner. Pt admitted he felt "weird' about feeling anxious about being w/ his family though he appeared more accepting of it.  Summary of Group: Pts were active and engaged in group skillbuilding session for anxiety, depression, and Chemical Dependency. Pts were encouraged to share about their experience of recovery, skills used, and challenges faced in past week. Pts check in briefly then processed, then learned skill of "how to get honest w/ yourself by assessing your self talk".   Margo CommonWesley E Swan, LPCA, LCASA

## 2017-04-13 ENCOUNTER — Encounter (HOSPITAL_COMMUNITY): Payer: Self-pay | Admitting: Licensed Clinical Social Worker

## 2017-04-13 ENCOUNTER — Ambulatory Visit (INDEPENDENT_AMBULATORY_CARE_PROVIDER_SITE_OTHER): Payer: BLUE CROSS/BLUE SHIELD | Admitting: Licensed Clinical Social Worker

## 2017-04-13 DIAGNOSIS — F902 Attention-deficit hyperactivity disorder, combined type: Secondary | ICD-10-CM

## 2017-04-13 DIAGNOSIS — F3341 Major depressive disorder, recurrent, in partial remission: Secondary | ICD-10-CM | POA: Diagnosis not present

## 2017-04-13 NOTE — Progress Notes (Signed)
   THERAPIST PROGRESS NOTE  Session Time: 10-11  Participation Level: Active  Behavioral Response: Neat and Well GroomedAlertEuthymic  Type of Therapy: Individual Therapy  Treatment Goals addressed: Anxiety  Interventions: CBT and Supportive  Summary: Gabriel Wagner is a 32 y.o. male who presents with hx of anxious depression and is recently responding positively to adjunctive use of Abilify w/ Prozac.   "Abilify is working very well; I woke up last Thursday and sprang out of bed; I worked out and I applied for an internship w/ [employer]" (met tx goals) Pt states he was motivated to work on his goals and is responding to medication.  "I still feel anxious and am not sure why that is; perhaps it has something to do w/ how 'high achieving' my family is".  "My parents divorced in 2003 and I think that had a big impact on me"  "I was always concerned w/ 'doing the right thing' as a child/adolescent; but grades didn't really matter to me; I liked studying for the sake of learning".   "I am a a lot like my anxious grandfather who had PTSD from WW2, was 'overly humble,' and worked for [employer]  Pt discusses his [employer] and how revolutionary his ideas were and how he was disappointed in himself since he was depressed in the midst of finishing the degree. Pt states he often tells himself that "failure is not an option". Counselor reflects that "perhaps pt is more ambitious than he realizes he is". Pt discusses ways that he "still judges himself for having depression, though he is trying not to".   Pt feels intrigued by "nonattachment" in Buddhism. Pt wants to explore mindfulness and how to make it apply more into his day-to-day life.  Suicidal/Homicidal: Nowithout intent/plan  Therapist Response: Counselor used open question, socratic questioning, reflection, empathy, and positive regard. Counselor used reflection of thoughts to help pt gain insight into his negative schema of  "failure is not an option". Counselor encouraged continued medication use since it appears to be working effectively.  Plan: Return again in 1 weeks.  Diagnosis:    ICD-10-CM   1. Recurrent major depressive disorder, in partial remission ([place]) F33.41   2. Attention deficit hyperactivity disorder (ADHD), combined type F90.2        Gabriel Wagner, LCAS-A 04/13/2017

## 2017-04-14 ENCOUNTER — Ambulatory Visit (HOSPITAL_COMMUNITY): Payer: Self-pay | Admitting: Licensed Clinical Social Worker

## 2017-04-20 ENCOUNTER — Ambulatory Visit (HOSPITAL_COMMUNITY): Payer: Self-pay | Admitting: Licensed Clinical Social Worker

## 2017-04-21 ENCOUNTER — Ambulatory Visit (INDEPENDENT_AMBULATORY_CARE_PROVIDER_SITE_OTHER): Payer: BLUE CROSS/BLUE SHIELD | Admitting: Licensed Clinical Social Worker

## 2017-04-21 DIAGNOSIS — F902 Attention-deficit hyperactivity disorder, combined type: Secondary | ICD-10-CM

## 2017-04-21 DIAGNOSIS — F3341 Major depressive disorder, recurrent, in partial remission: Secondary | ICD-10-CM

## 2017-04-28 ENCOUNTER — Ambulatory Visit (INDEPENDENT_AMBULATORY_CARE_PROVIDER_SITE_OTHER): Payer: BLUE CROSS/BLUE SHIELD | Admitting: Licensed Clinical Social Worker

## 2017-04-28 ENCOUNTER — Encounter (HOSPITAL_COMMUNITY): Payer: Self-pay | Admitting: Licensed Clinical Social Worker

## 2017-04-28 DIAGNOSIS — F902 Attention-deficit hyperactivity disorder, combined type: Secondary | ICD-10-CM | POA: Diagnosis not present

## 2017-04-28 DIAGNOSIS — F3341 Major depressive disorder, recurrent, in partial remission: Secondary | ICD-10-CM | POA: Diagnosis not present

## 2017-04-28 NOTE — Progress Notes (Signed)
  Weekly Group Progress Note  Program: OUTPATIENT SKILLS GROUP  Group Time: 5:30-6:30pm  Participation Level: Active  Behavioral Response: Appropriate and Sharing  Type of Therapy:  Psycho-education Group  Skills discussed: Increasing Emotional Vocabulary, feelings work  Summary of Progress: Pt states mood remains positive, denies depression sxs and or unmanageable anxiety. States he got outside today and walked his 3 dogs at a dog park. Gained insight into connection b/w "jealousy" and "inadequacy" through emotional group game. Pt states his primary negative feeling is "inadequacy" and feeling less than capable of doing a job or task.  Summary of Group: Pts were active and engaged in group skillbuilding session for anxiety, depression, and Chemical Dependency. Pts were encouraged to share about their experience of recovery, skills used, and challenges faced in past week. Pts checked in briefly then processed their challenges as a group, w/ each member seeking feedback form other members about their specific challenges. Skill discussed: Increasing Emotional understanding and vocabulary, feelings integration, somatic experiencing of feelings.   Margo CommonWesley E Swan, LPCA, LCASA

## 2017-04-28 NOTE — Progress Notes (Signed)
  Weekly Group Progress Note  Program: OUTPATIENT SKILLS GROUP  Group Time: 5:30-6:30pm  Participation Level: Active  Behavioral Response: Appropriate and Sharing  Type of Therapy:  Psycho-education Group  Skills discussed: Open Communication    Summary of Progress: Pt was active and engaged in group. HE presents as upbeat, smiling, and states his mood has "never been better and he can really feel a difference w/ Abilify". Pt expresses gratitude and encouragement towards other group members. He provides healthy feedback on "finding a job" for another group member who was struggling. Pt plans to continue in Aftercare group and see this Clinical research associatewriter individually.  Summary of Group: Pts were active and engaged in group skillbuilding session for anxiety, depression, and Chemical Dependency. Pts were encouraged to share about their experience of recovery, skills used, and challenges faced in past week. Pts checked in briefly then processed their challenges as a group, w/ each member seeking feedback form other members about their specific challenges. One new member was present and active during session.   Margo CommonWesley E Cashmere Harmes, LPCA, LCASA

## 2017-05-05 ENCOUNTER — Ambulatory Visit (HOSPITAL_COMMUNITY): Payer: Self-pay | Admitting: Licensed Clinical Social Worker

## 2017-05-12 ENCOUNTER — Ambulatory Visit (INDEPENDENT_AMBULATORY_CARE_PROVIDER_SITE_OTHER): Payer: BLUE CROSS/BLUE SHIELD | Admitting: Licensed Clinical Social Worker

## 2017-05-12 DIAGNOSIS — F3341 Major depressive disorder, recurrent, in partial remission: Secondary | ICD-10-CM | POA: Diagnosis not present

## 2017-05-12 DIAGNOSIS — F902 Attention-deficit hyperactivity disorder, combined type: Secondary | ICD-10-CM

## 2017-05-19 ENCOUNTER — Ambulatory Visit (INDEPENDENT_AMBULATORY_CARE_PROVIDER_SITE_OTHER): Payer: BLUE CROSS/BLUE SHIELD | Admitting: Licensed Clinical Social Worker

## 2017-05-19 DIAGNOSIS — F3341 Major depressive disorder, recurrent, in partial remission: Secondary | ICD-10-CM

## 2017-05-19 DIAGNOSIS — F902 Attention-deficit hyperactivity disorder, combined type: Secondary | ICD-10-CM

## 2017-05-21 ENCOUNTER — Encounter (HOSPITAL_COMMUNITY): Payer: Self-pay | Admitting: Licensed Clinical Social Worker

## 2017-05-21 NOTE — Progress Notes (Signed)
  Weekly Group Progress Note  Program: OUTPATIENT SKILLS GROUP  Group Time: 5:30-6:30pm  Participation Level: Active  Behavioral Response: Appropriate and Sharing  Type of Therapy:  Psycho-education Group  Skills discussed: Building services engineerMindfulness Meditation, Body Scan   Summary of Progress: Pt continues to report increased utilization of new coping skills including CBT-based challenging negative thinking, challenging "should statements", weighing pros and cons. Pt reports having a sustained positive mood and continues to benefit "significantly from medication changes".  Summary of Group: Pts were active and engaged in group skillbuilding session for anxiety, depression, and Chemical Dependency. Pts were encouraged to share about their experience of recovery, skills used, and challenges faced in past week. Pts checked in briefly then processed their challenges as a group, w/ each member seeking feedback form other members about their specific challenges. Skill discussed: Mindfulness meditation and 15 min body scan exercise.   Margo CommonWesley E Mikyle Sox, LPCA, LCASA

## 2017-05-24 ENCOUNTER — Encounter (HOSPITAL_COMMUNITY): Payer: Self-pay | Admitting: Licensed Clinical Social Worker

## 2017-05-24 NOTE — Progress Notes (Signed)
  Weekly Group Progress Note  Program: OUTPATIENT SKILLS GROUP  Group Time: 5:30-6:30pm  Participation Level: Active  Behavioral Response: Appropriate and Sharing  Type of Therapy:  Psycho-education Group  Skills discussed: Challenging cognitive distortions    Summary of Progress: Pt was active and engaged in group. He continues to report sustained progress and no recent thoughts of depression or debilitating anxiety. Pt verbalizes strong insight into his MDD and hx of negative self talk. Pt stated he is benefiting greatly from "challenging the 'shoulds' in his life".  Summary of Group: Pts were active and engaged in group skillbuilding session for anxiety, depression, and Chemical Dependency. Pts were encouraged to share about their experience of recovery, skills used, and challenges faced in past week. Pts checked in briefly then processed their challenges as a group, w/ each member seeking feedback form other members about their specific challenges. Skill discussed: Cognitive distortions. Pts discussed a handout and read through various negative styles of thinking and learned coping skills.   Margo CommonWesley E Andres Bantz, LPCA, LCASA

## 2017-05-25 ENCOUNTER — Encounter (HOSPITAL_COMMUNITY): Payer: Self-pay | Admitting: Psychiatry

## 2017-05-25 ENCOUNTER — Ambulatory Visit (INDEPENDENT_AMBULATORY_CARE_PROVIDER_SITE_OTHER): Payer: BLUE CROSS/BLUE SHIELD | Admitting: Psychiatry

## 2017-05-25 VITALS — BP 130/88 | HR 90 | Ht 67.0 in | Wt 211.0 lb

## 2017-05-25 DIAGNOSIS — Z79899 Other long term (current) drug therapy: Secondary | ICD-10-CM | POA: Diagnosis not present

## 2017-05-25 DIAGNOSIS — F902 Attention-deficit hyperactivity disorder, combined type: Secondary | ICD-10-CM | POA: Diagnosis not present

## 2017-05-25 DIAGNOSIS — F3341 Major depressive disorder, recurrent, in partial remission: Secondary | ICD-10-CM

## 2017-05-25 MED ORDER — ARIPIPRAZOLE 5 MG PO TABS: 5.0000 mg | ORAL_TABLET | Freq: Every day | ORAL | 0 refills | Status: DC

## 2017-05-25 MED ORDER — FLUOXETINE HCL 40 MG PO CAPS: 40.0000 mg | ORAL_CAPSULE | Freq: Every day | ORAL | 1 refills | Status: AC

## 2017-05-25 MED ORDER — LISDEXAMFETAMINE DIMESYLATE 60 MG PO CAPS: 60.0000 mg | ORAL_CAPSULE | Freq: Every day | ORAL | 0 refills | Status: DC

## 2017-05-25 NOTE — Progress Notes (Signed)
BH MD/PA/NP OP Progress Note  05/25/2017 11:50 AM Gabriel Wagner  MRN:  [id number]  Chief Complaint: Doing really well HPI: Gabriel HoarBenjamin R Buchanon reports that the Abilify has been well tolerated and provided substantial benefit in mood and depression.  Does feel that some of the motivating effects of plateaued and we discussed increasing to 5 mg for a maintenance dose.  Again reiterated to risks of EPS and TD, and agreed to follow-up in 3 months.  No abnormal movements currently.  He is sleeping well.  He had some mild insomnia which resolved after the first week.  Denies any acute safety issues or suicidality.   Visit Diagnosis:    ICD-10-CM   1. Attention deficit hyperactivity disorder (ADHD), combined type F90.2   2. Recurrent major depressive disorder, in partial remission (HCC) F33.41 ARIPiprazole (ABILIFY) 5 MG tablet    FLUoxetine (PROZAC) 40 MG capsule    Past Psychiatric History: See intake H&P for full details. Reviewed, with no updates at this time.   Past Medical History: No past medical history on file.  Past Surgical History:  Procedure Laterality Date  . EYE MUSCLE SURGERY    . HERNIA REPAIR  2018    Family Psychiatric History: See intake H&P for full details. Reviewed, with no updates at this time.   Family History: No family history on file.  Social History:  Social History   Socioeconomic History  . Marital status: Single    Spouse name: Not on file  . Number of children: Not on file  . Years of education: Not on file  . Highest education level: Not on file  Occupational History  . Not on file  Social Needs  . Financial resource strain: Not on file  . Food insecurity:    Worry: Not on file    Inability: Not on file  . Transportation needs:    Medical: Not on file    Non-medical: Not on file  Tobacco Use  . Smoking status: Never Smoker  . Smokeless tobacco: Never Used  Substance and Sexual Activity  . Alcohol use: Yes    Alcohol/week: 1.2 oz     Types: 2 Glasses of wine per week    Comment: once a month or less  . Drug use: No  . Sexual activity: Not Currently  Lifestyle  . Physical activity:    Days per week: Not on file    Minutes per session: Not on file  . Stress: Not on file  Relationships  . Social connections:    Talks on phone: Not on file    Gets together: Not on file    Attends religious service: Not on file    Active member of club or organization: Not on file    Attends meetings of clubs or organizations: Not on file    Relationship status: Not on file  Other Topics Concern  . Not on file  Social History Narrative  . Not on file    Allergies: No Known Allergies  Metabolic Disorder Labs: No results found for: HGBA1C, MPG No results found for: PROLACTIN No results found for: CHOL, TRIG, HDL, CHOLHDL, VLDL, LDLCALC No results found for: TSH  Therapeutic Level Labs: No results found for: LITHIUM No results found for: VALPROATE No components found for:  CBMZ  Current Medications: Current Outpatient Medications  Medication Sig Dispense Refill  . ARIPiprazole (ABILIFY) 5 MG tablet Take 1 tablet (5 mg total) by mouth daily. 90 tablet 0  . FLUoxetine (PROZAC)  40 MG capsule Take 1 capsule (40 mg total) by mouth daily. 90 capsule 1  . Multiple Vitamin (MULTIVITAMIN) tablet Take 1 tablet by mouth daily.    Marland Kitchen lisdexamfetamine (VYVANSE) 60 MG capsule Take 1 capsule (60 mg total) by mouth daily before breakfast. 30 capsule 0  . [START ON 06/25/2017] lisdexamfetamine (VYVANSE) 60 MG capsule Take 1 capsule (60 mg total) by mouth daily before breakfast. 30 capsule 0  . [START ON 07/26/2017] lisdexamfetamine (VYVANSE) 60 MG capsule Take 1 capsule (60 mg total) by mouth daily before breakfast. 30 capsule 0   No current facility-administered medications for this visit.      Musculoskeletal: Strength & Muscle Tone: within normal limits Gait & Station: normal Patient leans: N/A  Psychiatric Specialty Exam: ROS   Blood pressure 130/88, pulse 90, height 5\' 7"  (1.702 m), weight 211 lb (95.7 kg), SpO2 97 %.Body mass index is 33.05 kg/m.  General Appearance: Casual and Fairly Groomed  Eye Contact:  Good  Speech:  Clear and Coherent and Normal Rate  Volume:  Normal  Mood:  Euthymic  Affect:  Appropriate and Congruent  Thought Process:  Coherent, Goal Directed and Descriptions of Associations: Intact  Orientation:  Full (Time, Place, and Person)  Thought Content: Logical   Suicidal Thoughts:  No  Homicidal Thoughts:  No  Memory:  Immediate;   Fair  Judgement:  Fair  Insight:  Good  Psychomotor Activity:  Normal  Concentration:  Concentration: Good  Recall:  Good  Fund of Knowledge: Good  Language: Good  Akathisia:  Negative  Handed:  Right  AIMS (if indicated): not done  Assets:  Communication Skills Desire for Improvement Financial Resources/Insurance Housing  ADL's:  Intact  Cognition: WNL  Sleep:  Good   Screenings:   Assessment and Plan:  LEALON VANPUTTEN presents with gradually improving symptoms currently in partial remission.  Would benefit from an increase in Abilify to 5 mg.  No intolerance or EPS as of yet.  No acute safety issues and we will follow-up in 3 months.  Disclosed to patient that this Clinical research associate is leaving this practice at the end of August 2019, and patients always has the right to choose their provider. Reassured patient that office will work to provide smooth transition of care whether they wish to remain at this office, or to continue with this provider, or seek alternative care options in community.  They expressed understanding.   1. Attention deficit hyperactivity disorder (ADHD), combined type   2. Recurrent major depressive disorder, in partial remission (HCC)     Status of current problems: stable  Labs Ordered: No orders of the defined types were placed in this encounter.   Labs Reviewed: n/a  Collateral Obtained/Records Reviewed: n/a  Plan:   Continue Vyvanse and Prozac Increase Abilify to 5 mg rtc 3 months  I spent 15 minutes with the patient in direct face-to-face clinical care.  Greater than 50% of this time was spent in counseling and coordination of care with the patient.    [name], MD 05/25/2017, 11:50 AM

## 2017-05-26 ENCOUNTER — Ambulatory Visit (HOSPITAL_COMMUNITY): Payer: Self-pay | Admitting: Licensed Clinical Social Worker

## 2017-06-02 ENCOUNTER — Ambulatory Visit (INDEPENDENT_AMBULATORY_CARE_PROVIDER_SITE_OTHER): Payer: BLUE CROSS/BLUE SHIELD | Admitting: Licensed Clinical Social Worker

## 2017-06-02 DIAGNOSIS — F902 Attention-deficit hyperactivity disorder, combined type: Secondary | ICD-10-CM

## 2017-06-02 DIAGNOSIS — F3341 Major depressive disorder, recurrent, in partial remission: Secondary | ICD-10-CM | POA: Diagnosis not present

## 2017-06-03 ENCOUNTER — Encounter (HOSPITAL_COMMUNITY): Payer: Self-pay | Admitting: Licensed Clinical Social Worker

## 2017-06-03 NOTE — Progress Notes (Signed)
  Weekly Group Progress Note  Program: OUTPATIENT SKILLS GROUP  Group Time: 5:30-6:30pm  Participation Level: Active  Behavioral Response: Appropriate and Sharing  Type of Therapy:  Psycho-education Group  Skills discussed: Coping skills for anger/irriation management   Summary of Progress: Pt continues to report strong progress. He denies significant impairment in functioning in past 2 weeks. He offered helpful and supportive feedback for another member who was struggling w/ anger. Pt reported he plans to meet w/ his academic advisor for first time in 2 years to discuss job possibilities.  Summary of Group: Pts were active and engaged in group skillbuilding session for anxiety, depression, and Chemical Dependency. Pts were encouraged to share about their experience of recovery, skills used, and challenges faced in past week. Pts checked in briefly then processed their challenges as a group, w/ each member seeking feedback form other members about their specific challenges.    Margo Common, LPCA, LCASA

## 2017-06-09 ENCOUNTER — Ambulatory Visit (INDEPENDENT_AMBULATORY_CARE_PROVIDER_SITE_OTHER): Payer: BLUE CROSS/BLUE SHIELD | Admitting: Licensed Clinical Social Worker

## 2017-06-09 DIAGNOSIS — F3341 Major depressive disorder, recurrent, in partial remission: Secondary | ICD-10-CM

## 2017-06-09 DIAGNOSIS — F902 Attention-deficit hyperactivity disorder, combined type: Secondary | ICD-10-CM | POA: Diagnosis not present

## 2017-06-14 ENCOUNTER — Encounter (HOSPITAL_COMMUNITY): Payer: Self-pay | Admitting: Licensed Clinical Social Worker

## 2017-06-14 NOTE — Progress Notes (Signed)
  Weekly Group Progress Note  Program: OUTPATIENT SKILLS GROUP  Group Time: 5:30-6:30pm  Participation Level: Active  Behavioral Response: Appropriate and Sharing  Type of Therapy:  Psycho-education Group  Skills discussed: DEARMAN, DBT Interpersonal Effectiveness  Summary of Progress: Pt reports continued progress and sustained remission of depressive sxs. He denies major anxiety and/or stress impacting his functioning. He states he met w/ his former Risk analyst and is working to "get a career that he loves asap". Pt was active and engaged, providing helpful feedback for another group member who shared about feeling "powerless over anxiety". Pt reported he feels he manages his anxiety and depression effectively and believes himself to be more "hopeful" than before starting medication and therapy.  Summary of Group: Pts were active and engaged in group skillbuilding session for anxiety, depression, and Chemical Dependency. Pts were encouraged to share about their experience of recovery, skills used, and challenges faced in past week. Pts checked in briefly then processed their challenges as a group, w/ each member seeking feedback from other members about their specific challenges. DBT Skills of [facility] were discussed and taught in 30 min psychoed.   [name], LPCA, LCASA

## 2017-06-16 ENCOUNTER — Ambulatory Visit (HOSPITAL_COMMUNITY): Payer: Self-pay | Admitting: Licensed Clinical Social Worker

## 2017-06-30 ENCOUNTER — Ambulatory Visit (INDEPENDENT_AMBULATORY_CARE_PROVIDER_SITE_OTHER): Payer: BLUE CROSS/BLUE SHIELD | Admitting: Licensed Clinical Social Worker

## 2017-06-30 DIAGNOSIS — F3341 Major depressive disorder, recurrent, in partial remission: Secondary | ICD-10-CM | POA: Diagnosis not present

## 2017-07-07 ENCOUNTER — Ambulatory Visit (HOSPITAL_COMMUNITY): Payer: Self-pay | Admitting: Licensed Clinical Social Worker

## 2017-07-07 ENCOUNTER — Encounter (HOSPITAL_COMMUNITY): Payer: Self-pay | Admitting: Licensed Clinical Social Worker

## 2017-07-07 NOTE — Progress Notes (Signed)
  Weekly Group Progress Note  Program: OUTPATIENT SKILLS GROUP  Group Time: 5:30-6:30pm  Participation Level: Active  Behavioral Response: Appropriate and Sharing  Type of Therapy:  Psycho-education Group  Skills discussed: Boundary Setting   Summary of Progress: Pt was active and engaged in group. He shared that he suffered a broken tail bone when he fell accidentally in his house. Pt states he has been in severe pain and did not attend group due to pain the last 2 sessions. Pt denies any significant depression or anxiety though admits that he is still working on building his coping skills to address his passive communication. Pt continues to need help around becoming more assertive.  Summary of Group: Pts were active and engaged in group skillbuilding session for anxiety, depression, and Chemical Dependency. Pts were encouraged to share about their experience of recovery, skills used, and challenges faced in past week. Pts checked in briefly then processed their challenges as a group, w/ each member seeking feedback from other members about their specific challenges. 2 new members were present and discussed their goals for tx. Counselor led a 5 min mindful coloring exercise for focus. Counselor discussed boundary-setting and saying "no" and provided a handout on assertive communication.   Margo CommonWesley E Jenese Mischke, LPCA, LCASA

## 2017-07-14 ENCOUNTER — Ambulatory Visit (HOSPITAL_COMMUNITY): Payer: Self-pay | Admitting: Licensed Clinical Social Worker

## 2017-07-21 ENCOUNTER — Encounter (HOSPITAL_COMMUNITY): Payer: Self-pay | Admitting: Licensed Clinical Social Worker

## 2017-07-21 ENCOUNTER — Ambulatory Visit (INDEPENDENT_AMBULATORY_CARE_PROVIDER_SITE_OTHER): Payer: BLUE CROSS/BLUE SHIELD | Admitting: Licensed Clinical Social Worker

## 2017-07-21 DIAGNOSIS — F3341 Major depressive disorder, recurrent, in partial remission: Secondary | ICD-10-CM | POA: Diagnosis not present

## 2017-07-21 NOTE — Progress Notes (Signed)
  Weekly Group Progress Note  Program: OUTPATIENT SKILLS GROUP  Group Time: 5:30-6:30pm  Participation Level: Active  Behavioral Response: Appropriate and Sharing  Type of Therapy:  Psycho-education Group  Skills discussed: Progressive Muscle Relaxation and Anger management   Summary of Progress: Pt was active and moderately engaged in session. He states he feels physically better from his broken tail bone, and is aware that his last month has been "Challenging and lonely" since he was home recuperating. He is still working on getting full time employment. He continues to get erratic sleep and he lacks a predictable daily routine.  Summary of Group: Pts were active and engaged in group skillbuilding session for anxiety, depression, and Chemical Dependency. Pts were encouraged to share about their experience of recovery, skills used, and challenges faced in past week. Pts checked in briefly then processed their challenges as a group, w/ each member seeking feedback from other members about their specific challenges. Pts were guided in a Progressive Muscle Relaxation exercise for 10 min and processed experience afterward. Pts were encouraged to consider times in their day when they could benefit form PMR.   Margo CommonWesley E Swan, LPCA, LCASA

## 2017-07-27 ENCOUNTER — Ambulatory Visit (INDEPENDENT_AMBULATORY_CARE_PROVIDER_SITE_OTHER): Payer: BLUE CROSS/BLUE SHIELD | Admitting: Licensed Clinical Social Worker

## 2017-07-27 DIAGNOSIS — F902 Attention-deficit hyperactivity disorder, combined type: Secondary | ICD-10-CM

## 2017-07-27 DIAGNOSIS — F3341 Major depressive disorder, recurrent, in partial remission: Secondary | ICD-10-CM | POA: Diagnosis not present

## 2017-07-28 ENCOUNTER — Ambulatory Visit (HOSPITAL_COMMUNITY): Payer: Self-pay | Admitting: Licensed Clinical Social Worker

## 2017-07-30 ENCOUNTER — Encounter (HOSPITAL_COMMUNITY): Payer: Self-pay | Admitting: Licensed Clinical Social Worker

## 2017-07-30 NOTE — Progress Notes (Signed)
  Weekly Group Progress Note  Program: OUTPATIENT SKILLS GROUP  Group Time: 5:30-6:30pm  Participation Level: Active  Behavioral Response: Appropriate and Sharing  Type of Therapy:  Psycho-education Group  Skills discussed: Cognitive Behavioral Therapy, ABC, Rational Self Talk  Summary of Progress: Pt shared he has started going to gym 2 times per week and has hired a Systems analystpersonal trainer for first time in his life. He is excited by this and feels it will motivate him to lose weight. Pt shared helpful feedback w/ another group member who was expressing emotional pain and discouragement.  Summary of Group: Pts were active and engaged in group skillbuilding session for anxiety, depression, and Chemical Dependency. Pts were encouraged to share about their experience of recovery, skills used, and challenges faced in past week. Pts checked in briefly then processed their challenges as a group, w/ each member seeking feedback from other members about their specific challenges. Pts were guided in a lesson on CBT for relapse prevention and negative thinking. Pts were given a handout on "cognitive modeling" and guided through filling it out w/ a personal example. Pts verbalized understanding.   Margo CommonWesley E Swan, LPCA, LCASA

## 2017-08-11 ENCOUNTER — Ambulatory Visit (INDEPENDENT_AMBULATORY_CARE_PROVIDER_SITE_OTHER): Payer: BLUE CROSS/BLUE SHIELD | Admitting: Licensed Clinical Social Worker

## 2017-08-11 ENCOUNTER — Ambulatory Visit (HOSPITAL_COMMUNITY): Payer: Self-pay | Admitting: Licensed Clinical Social Worker

## 2017-08-11 DIAGNOSIS — F3341 Major depressive disorder, recurrent, in partial remission: Secondary | ICD-10-CM | POA: Diagnosis not present

## 2017-08-11 DIAGNOSIS — F902 Attention-deficit hyperactivity disorder, combined type: Secondary | ICD-10-CM | POA: Diagnosis not present

## 2017-08-13 ENCOUNTER — Encounter (HOSPITAL_COMMUNITY): Payer: Self-pay | Admitting: Licensed Clinical Social Worker

## 2017-08-13 NOTE — Progress Notes (Signed)
Patient ID: Gabriel Wagner, male   DOB: July 08, 1985, 32 y.o.   MRN: [id number]   Weekly Group Progress Note  Program: OUTPATIENT SKILLS GROUP  Group Time: 5:30-6:30pm  Participation Level: Active  Behavioral Response: Appropriate and Sharing  Type of Therapy:  Psycho-education Group  Skills discussed: Mindfulness, Urge Surfing   Summary of Progress: Pt was active and engaged in session. He continues to report sustained progress w/ his depression and denies any unmanageable depression sxs in "past few months". He does endorse anxiety but feels it is "mostly manageable" through challenging his negative self talk. Pt continues to work out w/ Systems analystpersonal trainer at gym twice weekly. He is working on eating healthy.   Summary of Group: Pts were active and engaged in group skillbuilding session for anxiety, depression, and Chemical Dependency. Pts were encouraged to share about their experience of recovery, skills used, and challenges faced in past week. Pts checked in briefly then processed their challenges as a group, w/ each member seeking feedback from other members about their specific challenges. Pts were guided in a lesson on importance of mindfulness for relapse prevention and toleration of anxiety/depression sxs. Pts were guided in a 15 min "urge surfing" exercise in which they practiced "not being overwhelmed by emotions/impulses".    Gabriel Wagner, LPCA, LCASA

## 2017-08-18 ENCOUNTER — Ambulatory Visit (HOSPITAL_COMMUNITY): Payer: Self-pay | Admitting: Licensed Clinical Social Worker

## 2017-08-18 ENCOUNTER — Ambulatory Visit (INDEPENDENT_AMBULATORY_CARE_PROVIDER_SITE_OTHER): Payer: BLUE CROSS/BLUE SHIELD | Admitting: Licensed Clinical Social Worker

## 2017-08-18 DIAGNOSIS — F3341 Major depressive disorder, recurrent, in partial remission: Secondary | ICD-10-CM

## 2017-08-23 ENCOUNTER — Encounter (HOSPITAL_COMMUNITY): Payer: Self-pay | Admitting: Licensed Clinical Social Worker

## 2017-08-23 NOTE — Progress Notes (Signed)
  Weekly Group Progress Note  Program: OUTPATIENT SKILLS GROUP  Group Time: 5:30-6:30pm  Participation Level: Active  Behavioral Response: Appropriate and Sharing  Type of Therapy:  Psycho-education Group  Skills discussed: Boundary Setting   Summary of Progress: Pt continues to report reduction in depression sxs w/ ongoing anxiety. Pt shared he feels anxious the night before his workouts and can often ruminate on possible negative outcomes. Pt shared w/ other members and expressed empathy and understanding. Counselor provided ways for pt to reframe his anxiety and instead focus on helpful bxs such as self care.  Summary of Group: Pts were active and engaged in group skillbuilding session for anxiety, depression, and Chemical Dependency. Pts were encouraged to share about their experience of recovery, skills used, and challenges faced in past week. Pts checked in briefly then processed their challenges as a group, w/ each member seeking feedback from other members about their specific challenges. Counselor led brief psychoed on boundary setting, interpersonal communication effectiveness.    Margo CommonWesley E Swan, LPCA, LCASA

## 2017-08-24 ENCOUNTER — Ambulatory Visit (INDEPENDENT_AMBULATORY_CARE_PROVIDER_SITE_OTHER): Payer: BLUE CROSS/BLUE SHIELD | Admitting: Psychiatry

## 2017-08-24 ENCOUNTER — Encounter (HOSPITAL_COMMUNITY): Payer: Self-pay | Admitting: Psychiatry

## 2017-08-24 VITALS — BP 132/88 | HR 95 | Ht 67.0 in | Wt 224.0 lb

## 2017-08-24 DIAGNOSIS — F3341 Major depressive disorder, recurrent, in partial remission: Secondary | ICD-10-CM

## 2017-08-24 DIAGNOSIS — T50905A Adverse effect of unspecified drugs, medicaments and biological substances, initial encounter: Secondary | ICD-10-CM

## 2017-08-24 DIAGNOSIS — R635 Abnormal weight gain: Secondary | ICD-10-CM | POA: Diagnosis not present

## 2017-08-24 DIAGNOSIS — F902 Attention-deficit hyperactivity disorder, combined type: Secondary | ICD-10-CM

## 2017-08-24 MED ORDER — TOPIRAMATE 25 MG PO TABS: 25.0000 mg | ORAL_TABLET | Freq: Every day | ORAL | 0 refills | Status: AC

## 2017-08-24 MED ORDER — LISDEXAMFETAMINE DIMESYLATE 60 MG PO CAPS: 60.0000 mg | ORAL_CAPSULE | Freq: Every day | ORAL | 0 refills | Status: AC

## 2017-08-24 NOTE — Progress Notes (Signed)
BH MD/PA/NP OP Progress Note  08/24/2017 2:21 PM Gabriel Wagner  MRN:  [id number]  Chief Complaint: Mood is doing better HPI: Gabriel Wagner reports that his mood has been doing much better and he feels like he is on a good track.  He has been a bit concerned about the weight gain and I agree with him.  We agreed to discontinue Abilify and initiate Topamax nightly to encourage weight loss.  He is working with a Pharmacist, hospitalphysical trainer on a routine basis.  He continues on Prozac and Vyvanse and feels like this combination still provide some benefit.    He feels like the Abilify helped him get where he needs to be in terms of his mood and he feels comfortable with a trial discontinuation given the weight gain.  No other acute concerns and we will follow-up in 2 months.  Wt Readings from Last 3 Encounters:  08/24/17 224 lb (101.6 kg)  05/25/17 211 lb (95.7 kg)  03/29/17 189 lb 12.8 oz (86.1 kg)   Visit Diagnosis:    ICD-10-CM   1. Weight gain due to medication R63.5 topiramate (TOPAMAX) 25 MG tablet   T50.905A   2. Recurrent major depressive disorder, in partial remission (HCC) F33.41   3. Attention deficit hyperactivity disorder (ADHD), combined type F90.2 lisdexamfetamine (VYVANSE) 60 MG capsule    lisdexamfetamine (VYVANSE) 60 MG capsule    lisdexamfetamine (VYVANSE) 60 MG capsule    Past Psychiatric History: See intake H&P for full details. Reviewed, with no updates at this time.  Past Medical History: No past medical history on file.  Past Surgical History:  Procedure Laterality Date  . EYE MUSCLE SURGERY    . HERNIA REPAIR  2018    Family Psychiatric History: See intake H&P for full details. Reviewed, with no updates at this time.   Family History: No family history on file.  Social History:  Social History   Socioeconomic History  . Marital status: Single    Spouse name: Not on file  . Number of children: Not on file  . Years of education: Not on file  . Highest  education level: Not on file  Occupational History  . Not on file  Social Needs  . Financial resource strain: Not on file  . Food insecurity:    Worry: Not on file    Inability: Not on file  . Transportation needs:    Medical: Not on file    Non-medical: Not on file  Tobacco Use  . Smoking status: Never Smoker  . Smokeless tobacco: Never Used  Substance and Sexual Activity  . Alcohol use: Yes    Alcohol/week: 1.2 oz    Types: 2 Glasses of wine per week    Comment: once a month or less  . Drug use: No  . Sexual activity: Not Currently  Lifestyle  . Physical activity:    Days per week: Not on file    Minutes per session: Not on file  . Stress: Not on file  Relationships  . Social connections:    Talks on phone: Not on file    Gets together: Not on file    Attends religious service: Not on file    Active member of club or organization: Not on file    Attends meetings of clubs or organizations: Not on file    Relationship status: Not on file  Other Topics Concern  . Not on file  Social History Narrative  . Not on file  Allergies: No Known Allergies  Metabolic Disorder Labs: No results found for: HGBA1C, MPG No results found for: PROLACTIN No results found for: CHOL, TRIG, HDL, CHOLHDL, VLDL, LDLCALC No results found for: TSH  Therapeutic Level Labs: No results found for: LITHIUM No results found for: VALPROATE No components found for:  CBMZ  Current Medications: Current Outpatient Medications  Medication Sig Dispense Refill  . FLUoxetine (PROZAC) 40 MG capsule Take 1 capsule (40 mg total) by mouth daily. 90 capsule 1  . [START ON 10/21/2017] lisdexamfetamine (VYVANSE) 60 MG capsule Take 1 capsule (60 mg total) by mouth daily before breakfast. 30 capsule 0  . Multiple Vitamin (MULTIVITAMIN) tablet Take 1 tablet by mouth daily.    Melene Muller ON 09/22/2017] lisdexamfetamine (VYVANSE) 60 MG capsule Take 1 capsule (60 mg total) by mouth daily before breakfast. 30  capsule 0  . lisdexamfetamine (VYVANSE) 60 MG capsule Take 1 capsule (60 mg total) by mouth daily before breakfast. 30 capsule 0  . topiramate (TOPAMAX) 25 MG tablet Take 1 tablet (25 mg total) by mouth at bedtime. 90 tablet 0   No current facility-administered medications for this visit.      Musculoskeletal: Strength & Muscle Tone: within normal limits Gait & Station: normal Patient leans: N/A  Psychiatric Specialty Exam: ROS  Blood pressure 132/88, pulse 95, height 5\' 7"  (1.702 m), weight 224 lb (101.6 kg), SpO2 99 %.Body mass index is 35.08 kg/m.  General Appearance: Casual and Fairly Groomed  Eye Contact:  Good  Speech:  Clear and Coherent and Normal Rate  Volume:  Normal  Mood:  Euthymic  Affect:  Appropriate and Congruent  Thought Process:  Coherent, Goal Directed and Descriptions of Associations: Intact  Orientation:  Full (Time, Place, and Person)  Thought Content: Logical   Suicidal Thoughts:  No  Homicidal Thoughts:  No  Memory:  Immediate;   Fair  Judgement:  Fair  Insight:  Good  Psychomotor Activity:  Normal  Concentration:  Concentration: Good  Recall:  Good  Fund of Knowledge: Good  Language: Good  Akathisia:  Negative  Handed:  Right  AIMS (if indicated): not done  Assets:  Communication Skills Desire for Improvement Financial Resources/Insurance Housing  ADL's:  Intact  Cognition: WNL  Sleep:  Good   Screenings:   Assessment and Plan:  Gabriel Wagner presents with overall remission of his depressive symptoms, so we agreed to discontinue Abilify given the 30 pounds weight gain he has had over the past 5 months.  He denies any other acute concerns and agrees to continue on Prozac and Vyvanse.  I suggested that a low-dose of Topamax may help mitigate some of the appetite increase and encourage weight loss in the coming months.  Cautioned him about the risk of SJS.  We will follow-up in 2 months or sooner if needed.  1. Weight gain due to  medication   2. Recurrent major depressive disorder, in partial remission (HCC)   3. Attention deficit hyperactivity disorder (ADHD), combined type    Status of current problems: stable  Labs Ordered: No orders of the defined types were placed in this encounter.  Labs Reviewed: n/a  Collateral Obtained/Records Reviewed: n/a  Plan:  Continue Vyvanse 60 mg daily and Prozac 40 mg topamax 25 mg nightly, okay to increase to 2 tablets in 1-2 weeks if needed rtc 3 months  [name], MD 08/24/2017, 2:21 PM

## 2017-08-25 ENCOUNTER — Ambulatory Visit (HOSPITAL_COMMUNITY): Payer: Self-pay | Admitting: Licensed Clinical Social Worker

## 2017-09-01 ENCOUNTER — Ambulatory Visit (INDEPENDENT_AMBULATORY_CARE_PROVIDER_SITE_OTHER): Payer: BLUE CROSS/BLUE SHIELD | Admitting: Licensed Clinical Social Worker

## 2017-09-01 ENCOUNTER — Encounter (HOSPITAL_COMMUNITY): Payer: Self-pay | Admitting: Licensed Clinical Social Worker

## 2017-09-01 DIAGNOSIS — F902 Attention-deficit hyperactivity disorder, combined type: Secondary | ICD-10-CM | POA: Diagnosis not present

## 2017-09-01 DIAGNOSIS — F3341 Major depressive disorder, recurrent, in partial remission: Secondary | ICD-10-CM

## 2017-09-01 NOTE — Progress Notes (Signed)
  Weekly Group Progress Note  Program: OUTPATIENT SKILLS GROUP  Group Time: 5:30-6:30pm  Participation Level: Active  Behavioral Response: Appropriate and Sharing  Type of Therapy:  Psycho-education Group  Skills discussed: Decatastrophizing   Summary of Progress: Continues to be active and engaged in group. Reports his depression and anxiety have been manageable for past 2 weeks. Worries about social anxiety and his lack of social interaction. Discussed fear of being judged by other people.  Summary of Group: Pts were active and engaged in group skillbuilding session for anxiety, depression, and Chemical Dependency. Pts were encouraged to share about their experience of recovery, skills used, and challenges faced in past week. Pts checked in briefly then processed their challenges as a group, w/ each member seeking feedback from other members about their specific challenges. Counselor led brief psychoed on decatastrophizing w/ handout and asked pts to provide examples.   Margo CommonWesley E Neima Lacross, LPCA, LCASA

## 2017-09-08 ENCOUNTER — Ambulatory Visit (HOSPITAL_COMMUNITY): Payer: Self-pay | Admitting: Licensed Clinical Social Worker

## 2017-09-15 ENCOUNTER — Ambulatory Visit (HOSPITAL_COMMUNITY): Payer: Self-pay | Admitting: Licensed Clinical Social Worker

## 2017-09-22 ENCOUNTER — Ambulatory Visit (INDEPENDENT_AMBULATORY_CARE_PROVIDER_SITE_OTHER): Payer: BLUE CROSS/BLUE SHIELD | Admitting: Licensed Clinical Social Worker

## 2017-09-22 DIAGNOSIS — F3341 Major depressive disorder, recurrent, in partial remission: Secondary | ICD-10-CM | POA: Diagnosis not present

## 2017-09-26 ENCOUNTER — Encounter (HOSPITAL_COMMUNITY): Payer: Self-pay | Admitting: Licensed Clinical Social Worker

## 2017-09-26 NOTE — Progress Notes (Signed)
  Weekly Group Progress Note  Program: OUTPATIENT SKILLS GROUP  Group Time: 5:30-6:30pm  Participation Level: Active  Behavioral Response: Appropriate and Sharing  Type of Therapy:  Psycho-education Group  Skills discussed: Mindfulness, Guided Meditation   Summary of Progress: Pt was upbeat, smiled often, and made strong eye contact throughout group. He reported he continues to sustain changes in bx including waking up early for exercise at gym 2 days per week. He continues to struggle w/ isolation and feeling hesitant about getting out to meet other people w/ shared interest. Counselor used MI techniques to strengthen pt's "change talk".  Summary of Group: Pts were active and engaged in group skillbuilding session for anxiety, depression, and Chemical Dependency. Pts were encouraged to share about their experience of recovery, skills used, and challenges faced in past week. Pts checked in briefly then processed their challenges as a group, w/ each member seeking feedback from other members about their specific challenges. Counselor led brief psychoed on mindfulness, led a guided meditation and processed the experience afterward.   Margo CommonWesley E Swan, LPCA, LCASA

## 2017-09-29 ENCOUNTER — Ambulatory Visit (HOSPITAL_COMMUNITY): Payer: Self-pay | Admitting: Licensed Clinical Social Worker

## 2017-10-06 ENCOUNTER — Ambulatory Visit (INDEPENDENT_AMBULATORY_CARE_PROVIDER_SITE_OTHER): Payer: BLUE CROSS/BLUE SHIELD | Admitting: Licensed Clinical Social Worker

## 2017-10-06 DIAGNOSIS — F3341 Major depressive disorder, recurrent, in partial remission: Secondary | ICD-10-CM | POA: Diagnosis not present

## 2017-10-07 ENCOUNTER — Encounter (HOSPITAL_COMMUNITY): Payer: Self-pay | Admitting: Licensed Clinical Social Worker

## 2017-10-07 NOTE — Progress Notes (Signed)
  Weekly Group Progress Note  Program: OUTPATIENT SKILLS GROUP  Group Time: 5:30-6:30pm  Participation Level: Active  Behavioral Response: Appropriate and Sharing  Type of Therapy:  Psycho-education Group  Skills discussed: Communication, Assertiveness   Summary of Progress: Pt was active, engaged, made strong eye contact, and shared openly. He reported he recently contact old friends from 2 years ago and planned a visit down to [place] to see them. Pt felt happy and excited to be able to work on his social anxiety this way and states he had a good time on the short trip during Labor Day.  Summary of Group: Pts were active and engaged in group skill building psychoeducation session. Pts shared about their past week, dysfunctional bx, and attempts at practicing boundaries, speaking assertively, and exercising mindfulness. One new pt was present though her voice was impaired due to laryngitis. Counselor led brief communication activity in which pt's turn back-to-back and draw a geometic figure based on description of partner. Counselor then passed out handout on styles of communication and processed it w/pts.    Margo Common, LPCA, LCASA

## 2017-10-13 ENCOUNTER — Ambulatory Visit (INDEPENDENT_AMBULATORY_CARE_PROVIDER_SITE_OTHER): Payer: BLUE CROSS/BLUE SHIELD | Admitting: Licensed Clinical Social Worker

## 2017-10-13 DIAGNOSIS — F3341 Major depressive disorder, recurrent, in partial remission: Secondary | ICD-10-CM | POA: Diagnosis not present

## 2017-10-15 ENCOUNTER — Encounter (HOSPITAL_COMMUNITY): Payer: Self-pay | Admitting: Licensed Clinical Social Worker

## 2017-10-15 NOTE — Progress Notes (Signed)
  Weekly Group Progress Note  Program: OUTPATIENT SKILLS GROUP  Group Time: 5:30-6:30pm  Participation Level: Active  Behavioral Response: Appropriate and Sharing  Type of Therapy:  Psycho-education Group  Skills discussed: Anger Management   Summary of Progress: Pt was clean, well groomed, and made strong eye contact in session. He stated he has "not been angry in over 4 years" and attributed this to his depression. Counselor and pt discussed ways that depression robs people of feeling emotions besides sadness. Pt agreed and stated he will attempt to recognize when he feels irritated or a sense of injustice.  Summary of Group: Pts were active and engaged in group skill building psychoeducation session. Pts shared about their past week, dysfunctional bx, and attempts at increasing assertive communication. Pts watched a 10 min TED talk on Anger and discussed anger management strategies.   Margo CommonWesley E Swan, LPCA, LCASA

## 2017-10-20 ENCOUNTER — Ambulatory Visit (HOSPITAL_COMMUNITY): Payer: Self-pay | Admitting: Licensed Clinical Social Worker

## 2017-10-27 ENCOUNTER — Ambulatory Visit (HOSPITAL_COMMUNITY): Payer: Self-pay | Admitting: Licensed Clinical Social Worker
